# Patient Record
Sex: Female | Born: 1970 | Race: White | Hispanic: No | Marital: Married | Smoking: Former smoker
Health system: Southern US, Community
[De-identification: ages and names within clinical notes are randomized; demographics above are authoritative.]

## PROBLEM LIST (undated history)

## (undated) DIAGNOSIS — E119 Type 2 diabetes mellitus without complications: Secondary | ICD-10-CM

## (undated) DIAGNOSIS — K760 Fatty (change of) liver, not elsewhere classified: Secondary | ICD-10-CM

## (undated) DIAGNOSIS — E7800 Pure hypercholesterolemia, unspecified: Secondary | ICD-10-CM

## (undated) DIAGNOSIS — E042 Nontoxic multinodular goiter: Secondary | ICD-10-CM

## (undated) DIAGNOSIS — E559 Vitamin D deficiency, unspecified: Secondary | ICD-10-CM

## (undated) DIAGNOSIS — G5603 Carpal tunnel syndrome, bilateral upper limbs: Secondary | ICD-10-CM

## (undated) DIAGNOSIS — I10 Essential (primary) hypertension: Secondary | ICD-10-CM

## (undated) DIAGNOSIS — K219 Gastro-esophageal reflux disease without esophagitis: Secondary | ICD-10-CM

## (undated) HISTORY — PX: TONSILLECTOMY: SUR1361

## (undated) HISTORY — PX: BLADDER SURGERY: SHX569

## (undated) HISTORY — PX: CARPAL TUNNEL RELEASE: SHX101

---

## 2006-04-07 ENCOUNTER — Other Ambulatory Visit: Payer: Self-pay

## 2006-04-07 ENCOUNTER — Emergency Department: Payer: Self-pay | Admitting: Emergency Medicine

## 2006-11-07 ENCOUNTER — Emergency Department: Payer: Self-pay | Admitting: Emergency Medicine

## 2009-04-19 ENCOUNTER — Emergency Department: Payer: Self-pay | Admitting: Emergency Medicine

## 2009-10-16 ENCOUNTER — Emergency Department: Payer: Self-pay | Admitting: Emergency Medicine

## 2009-12-01 ENCOUNTER — Emergency Department: Payer: Self-pay | Admitting: Emergency Medicine

## 2011-08-01 DIAGNOSIS — E559 Vitamin D deficiency, unspecified: Secondary | ICD-10-CM | POA: Insufficient documentation

## 2011-08-01 DIAGNOSIS — E7800 Pure hypercholesterolemia, unspecified: Secondary | ICD-10-CM | POA: Insufficient documentation

## 2011-08-01 DIAGNOSIS — K760 Fatty (change of) liver, not elsewhere classified: Secondary | ICD-10-CM | POA: Insufficient documentation

## 2011-08-01 DIAGNOSIS — I10 Essential (primary) hypertension: Secondary | ICD-10-CM | POA: Insufficient documentation

## 2013-03-03 DIAGNOSIS — IMO0002 Reserved for concepts with insufficient information to code with codable children: Secondary | ICD-10-CM | POA: Insufficient documentation

## 2013-03-03 DIAGNOSIS — E1165 Type 2 diabetes mellitus with hyperglycemia: Secondary | ICD-10-CM | POA: Insufficient documentation

## 2013-04-05 DIAGNOSIS — Z8041 Family history of malignant neoplasm of ovary: Secondary | ICD-10-CM | POA: Insufficient documentation

## 2013-10-06 ENCOUNTER — Emergency Department: Payer: Self-pay | Admitting: Emergency Medicine

## 2013-10-06 LAB — BASIC METABOLIC PANEL
Creatinine: 0.9 mg/dL (ref 0.60–1.30)
EGFR (African American): >60
Glucose: 241 mg/dL — ABNORMAL HIGH (ref 65–99)
Osmolality: 284 (ref 275–301)
Potassium: 3.8 mmol/L (ref 3.5–5.1)
Sodium: 138 mmol/L (ref 136–145)

## 2013-10-06 LAB — CBC
HCT: 41 % (ref 35.0–47.0)
HGB: 14.3 g/dL (ref 12.0–16.0)
MCH: 29.4 pg (ref 26.0–34.0)
MCV: 84 fL (ref 80–100)
Platelet: 133 10*3/uL — ABNORMAL LOW (ref 150–440)

## 2013-10-06 LAB — TROPONIN I: Troponin-I: <0.02 ng/mL

## 2014-03-07 ENCOUNTER — Ambulatory Visit: Payer: Self-pay | Admitting: Family Medicine

## 2015-05-02 ENCOUNTER — Other Ambulatory Visit: Payer: Self-pay | Admitting: Family Medicine

## 2015-05-02 DIAGNOSIS — Z1231 Encounter for screening mammogram for malignant neoplasm of breast: Secondary | ICD-10-CM

## 2015-05-06 ENCOUNTER — Ambulatory Visit
Admission: RE | Admit: 2015-05-06 | Discharge: 2015-05-06 | Disposition: A | Discharge status: Home / Self Care | Payer: BLUE CROSS/BLUE SHIELD | Source: Ambulatory Visit | Attending: Family Medicine | Admitting: Family Medicine

## 2015-05-06 DIAGNOSIS — Z1231 Encounter for screening mammogram for malignant neoplasm of breast: Secondary | ICD-10-CM

## 2016-03-04 ENCOUNTER — Ambulatory Visit
Admission: RE | Admit: 2016-03-04 | Discharge: 2016-03-04 | Disposition: A | Discharge status: Home / Self Care | Payer: BLUE CROSS/BLUE SHIELD | Source: Ambulatory Visit | Attending: Family Medicine | Admitting: Family Medicine

## 2016-03-04 ENCOUNTER — Other Ambulatory Visit: Payer: Self-pay | Admitting: Family Medicine

## 2016-03-04 DIAGNOSIS — M4686 Other specified inflammatory spondylopathies, lumbar region: Secondary | ICD-10-CM | POA: Diagnosis not present

## 2016-03-04 DIAGNOSIS — M545 Low back pain: Secondary | ICD-10-CM | POA: Diagnosis present

## 2016-04-13 ENCOUNTER — Other Ambulatory Visit: Payer: Self-pay | Admitting: Family Medicine

## 2016-04-13 DIAGNOSIS — Z1231 Encounter for screening mammogram for malignant neoplasm of breast: Secondary | ICD-10-CM

## 2016-06-17 ENCOUNTER — Ambulatory Visit: Payer: BLUE CROSS/BLUE SHIELD

## 2016-07-08 ENCOUNTER — Other Ambulatory Visit: Payer: Self-pay | Admitting: Family Medicine

## 2016-07-08 ENCOUNTER — Ambulatory Visit
Admission: RE | Admit: 2016-07-08 | Discharge: 2016-07-08 | Disposition: A | Discharge status: Home / Self Care | Payer: BLUE CROSS/BLUE SHIELD | Source: Ambulatory Visit | Attending: Family Medicine | Admitting: Family Medicine

## 2016-07-08 DIAGNOSIS — Z1231 Encounter for screening mammogram for malignant neoplasm of breast: Secondary | ICD-10-CM

## 2016-10-22 DIAGNOSIS — Z6841 Body Mass Index (BMI) 40.0 and over, adult: Secondary | ICD-10-CM | POA: Insufficient documentation

## 2016-11-05 ENCOUNTER — Other Ambulatory Visit: Payer: Self-pay | Admitting: Family Medicine

## 2016-11-05 DIAGNOSIS — Z8041 Family history of malignant neoplasm of ovary: Secondary | ICD-10-CM

## 2016-11-13 ENCOUNTER — Ambulatory Visit
Admission: RE | Admit: 2016-11-13 | Discharge: 2016-11-13 | Disposition: A | Discharge status: Home / Self Care | Payer: BLUE CROSS/BLUE SHIELD | Source: Ambulatory Visit | Attending: Family Medicine | Admitting: Family Medicine

## 2016-11-13 DIAGNOSIS — Z8041 Family history of malignant neoplasm of ovary: Secondary | ICD-10-CM | POA: Diagnosis present

## 2016-11-13 DIAGNOSIS — R938 Abnormal findings on diagnostic imaging of other specified body structures: Secondary | ICD-10-CM | POA: Diagnosis not present

## 2016-11-27 DIAGNOSIS — G5603 Carpal tunnel syndrome, bilateral upper limbs: Secondary | ICD-10-CM | POA: Insufficient documentation

## 2017-02-14 ENCOUNTER — Encounter: Payer: Self-pay | Admitting: Medical Oncology

## 2017-02-14 ENCOUNTER — Emergency Department: Payer: BLUE CROSS/BLUE SHIELD

## 2017-02-14 ENCOUNTER — Emergency Department
Admission: EM | Admit: 2017-02-14 | Discharge: 2017-02-14 | Disposition: A | Discharge status: Home / Self Care | Payer: BLUE CROSS/BLUE SHIELD | Attending: Emergency Medicine | Admitting: Emergency Medicine

## 2017-02-14 DIAGNOSIS — Z7984 Long term (current) use of oral hypoglycemic drugs: Secondary | ICD-10-CM | POA: Insufficient documentation

## 2017-02-14 DIAGNOSIS — G510 Bell's palsy: Secondary | ICD-10-CM | POA: Diagnosis not present

## 2017-02-14 DIAGNOSIS — I10 Essential (primary) hypertension: Secondary | ICD-10-CM | POA: Insufficient documentation

## 2017-02-14 DIAGNOSIS — R202 Paresthesia of skin: Secondary | ICD-10-CM

## 2017-02-14 DIAGNOSIS — Z79899 Other long term (current) drug therapy: Secondary | ICD-10-CM | POA: Insufficient documentation

## 2017-02-14 DIAGNOSIS — E119 Type 2 diabetes mellitus without complications: Secondary | ICD-10-CM | POA: Diagnosis not present

## 2017-02-14 HISTORY — DX: Pure hypercholesterolemia, unspecified: E78.00

## 2017-02-14 HISTORY — DX: Essential (primary) hypertension: I10

## 2017-02-14 HISTORY — DX: Type 2 diabetes mellitus without complications: E11.9

## 2017-02-14 LAB — CBC
HCT: 43.6 % (ref 35.0–47.0)
HEMOGLOBIN: 14.6 g/dL (ref 12.0–16.0)
MCH: 27.9 pg (ref 26.0–34.0)
MCHC: 33.5 g/dL (ref 32.0–36.0)
MCV: 83.3 fL (ref 80.0–100.0)
PLATELETS: 151 10*3/uL (ref 150–440)
RBC: 5.24 MIL/uL — AB (ref 3.80–5.20)
RDW: 13.4 % (ref 11.5–14.5)
WBC: 7.6 10*3/uL (ref 3.6–11.0)

## 2017-02-14 LAB — COMPREHENSIVE METABOLIC PANEL
ALBUMIN: 3.9 g/dL (ref 3.5–5.0)
ALT: 76 U/L — ABNORMAL HIGH (ref 14–54)
ANION GAP: 10 (ref 5–15)
AST: 75 U/L — AB (ref 15–41)
Alkaline Phosphatase: 44 U/L (ref 38–126)
BUN: 15 mg/dL (ref 6–20)
CHLORIDE: 105 mmol/L (ref 101–111)
CO2: 23 mmol/L (ref 22–32)
Calcium: 8.8 mg/dL — ABNORMAL LOW (ref 8.9–10.3)
Creatinine, Ser: 0.68 mg/dL (ref 0.44–1.00)
GFR calc Af Amer: >60 mL/min (ref >60)
GFR calc non Af Amer: >60 mL/min (ref >60)
GLUCOSE: 188 mg/dL — AB (ref 65–99)
POTASSIUM: 3.9 mmol/L (ref 3.5–5.1)
SODIUM: 138 mmol/L (ref 135–145)
Total Bilirubin: 0.5 mg/dL (ref 0.3–1.2)
Total Protein: 7.5 g/dL (ref 6.5–8.1)

## 2017-02-14 MED ORDER — PREDNISONE 20 MG PO TABS: 40.0000 mg | ORAL_TABLET | Freq: Every day | ORAL | 0 refills | Status: AC

## 2017-02-14 MED ORDER — VALACYCLOVIR HCL 1 G PO TABS: 2000.0000 mg | ORAL_TABLET | Freq: Three times a day (TID) | ORAL | 0 refills | Status: AC

## 2017-02-14 NOTE — ED Notes (Signed)

## 2017-02-14 NOTE — ED Provider Notes (Signed)
[facility] Emergency Department Provider Note  Time seen: 10:05 AM  I have reviewed the triage vital signs and the nursing notes.   HISTORY  Chief Complaint Numbness    HPI Whitney Morris is a 46 y.o. female  with a past medical history of diabetes, hypertension, hyperlipidemia who presents to the emergency department with left facial numbness. According to the patient since yesterday morning she has been experiencing tingling and numbness of the left face and left side of the tongue. Denies any facial droop. Denies any headache. Denies any arm weakness or numbness. Denies confusion slurred speech or difficulty thinking. Patient denies any recent illnesses, cough congestion and ear pain or ringing. Patient denies any history of stroke or irregular heart rhythm.  Past Medical History:  Diagnosis Date  . Diabetes mellitus without complication ([place])   . High cholesterol   . Hypertension     There are no active problems to display for this patient.   Past Surgical History:  Procedure Laterality Date  . CARPAL TUNNEL RELEASE      Prior to Admission medications   Not on File    Allergies  Allergen Reactions  . Septra [Sulfamethoxazole-Trimethoprim]     Family History  Problem Relation Age of Onset  . Breast cancer Neg Hx     Social History Social History  Substance Use Topics  . Smoking status: Not on file  . Smokeless tobacco: Not on file  . Alcohol use Not on file    Review of Systems Constitutional: Negative for fever. Eyes: Negative for visual changes. ENT: Negative for congestion. No ear pain. Cardiovascular: Negative for chest pain. Respiratory: Negative for shortness of breath. Gastrointestinal: Negative for abdominal pain, vomiting Musculoskeletal: No back or extremity pain. Skin: Negative for rash. Neurological: Negative for headaches, focal weakness. Tingling of the left side of her face. All other ROS  negative  ____________________________________________   PHYSICAL EXAM:  VITAL SIGNS: ED Triage Vitals [02/14/17 0939]  Enc Vitals Group     BP      Pulse Rate (!) 103     Resp 18     Temp 98.3 F (36.8 C)     Temp Source Oral     SpO2 100 %     Weight 290 lb (131.5 kg)     Height 5\' 9"  (1.753 m)     Head Circumference      Peak Flow      Pain Score      Pain Loc      Pain Edu?      Excl. in [place]?     Constitutional: Alert and oriented. Well appearing and in no distress. Eyes: Normal exam ENT   Head: Normocephalic and atraumatic   Mouth/Throat: Mucous membranes are moist. Cardiovascular: Normal rate, regular rhythm. No murmur Respiratory: Normal respiratory effort without tachypnea nor retractions. Breath sounds are clear  Gastrointestinal: Soft and nontender. No distention.  T Musculoskeletal: Nontender with normal range of motion in all extremities.  Neurologic:  Normal speech and language. Possible slight left facial droop. Cranial nerves otherwise intact denies any sensory deficits on exam although states she feels a tingling sensation. Equal grip strengths. 5/5 motor in all extremities. Finger to nose testing intact. No pronator drift. Skin:  Skin is warm, dry and intact.  Psychiatric: Mood and affect are normal.  ____________________________________________     RADIOLOGY  MRI is normal  ____________________________________________   INITIAL IMPRESSION / ASSESSMENT AND PLAN / ED COURSE  Pertinent labs & imaging results that were available during my care of the patient were reviewed by me and considered in my medical decision making (see chart for details).  The patient presents to the emergency department with left facial tingling/numbness. On exam the patient has a possible very slight left facial droop. Denies any sensory changes on my exam however states she feels a tingling sensation in the left face. Patient's symptoms are concerning for possible  CVA versus early Bell's palsy. Patient's neurological exam is otherwise normal/intact. We'll check labs, proceed with an MRI of the brain to further evaluate. Patient agreeable plan.  MRI is normal. Given the slight numbness sensation with possible slight facial droop we will treat for early Bell's palsy. I discussed precautions including taping her eye shut taking the medications and following up with her doctor. Patient agreeable to plan.  ____________________________________________   FINAL CLINICAL IMPRESSION(S) / ED DIAGNOSES  Paresthesia Left facial numbness    Harvest Dark, MD 02/14/17 1306

## 2017-02-14 NOTE — ED Notes (Signed)
Patient denies pain and is resting comfortably.  

## 2017-02-14 NOTE — ED Triage Notes (Signed)
Pt reports left sided facial numbness that began yesterday morning around 1030. No other weaknesses/numbness. Grips equal, pt ambulatory, speech clear. No drooping noticed at time of triage. Denies pain.

## 2017-06-29 ENCOUNTER — Other Ambulatory Visit: Payer: Self-pay | Admitting: Family Medicine

## 2017-06-29 DIAGNOSIS — Z1231 Encounter for screening mammogram for malignant neoplasm of breast: Secondary | ICD-10-CM

## 2017-07-14 ENCOUNTER — Ambulatory Visit
Admission: RE | Admit: 2017-07-14 | Discharge: 2017-07-14 | Disposition: A | Discharge status: Home / Self Care | Payer: BLUE CROSS/BLUE SHIELD | Source: Ambulatory Visit | Attending: Family Medicine | Admitting: Family Medicine

## 2017-07-14 DIAGNOSIS — Z1231 Encounter for screening mammogram for malignant neoplasm of breast: Secondary | ICD-10-CM | POA: Insufficient documentation

## 2018-06-03 ENCOUNTER — Emergency Department
Admission: EM | Admit: 2018-06-03 | Discharge: 2018-06-04 | Disposition: A | Discharge status: Home / Self Care | Payer: BLUE CROSS/BLUE SHIELD | Attending: Emergency Medicine | Admitting: Emergency Medicine

## 2018-06-03 ENCOUNTER — Emergency Department: Payer: BLUE CROSS/BLUE SHIELD

## 2018-06-03 ENCOUNTER — Other Ambulatory Visit: Payer: Self-pay

## 2018-06-03 ENCOUNTER — Encounter: Payer: Self-pay | Admitting: Emergency Medicine

## 2018-06-03 DIAGNOSIS — R6889 Other general symptoms and signs: Secondary | ICD-10-CM

## 2018-06-03 DIAGNOSIS — E7800 Pure hypercholesterolemia, unspecified: Secondary | ICD-10-CM | POA: Insufficient documentation

## 2018-06-03 DIAGNOSIS — E119 Type 2 diabetes mellitus without complications: Secondary | ICD-10-CM | POA: Diagnosis not present

## 2018-06-03 DIAGNOSIS — Z794 Long term (current) use of insulin: Secondary | ICD-10-CM | POA: Insufficient documentation

## 2018-06-03 DIAGNOSIS — R0989 Other specified symptoms and signs involving the circulatory and respiratory systems: Secondary | ICD-10-CM

## 2018-06-03 DIAGNOSIS — Z79899 Other long term (current) drug therapy: Secondary | ICD-10-CM | POA: Insufficient documentation

## 2018-06-03 DIAGNOSIS — R070 Pain in throat: Secondary | ICD-10-CM | POA: Diagnosis present

## 2018-06-03 DIAGNOSIS — I10 Essential (primary) hypertension: Secondary | ICD-10-CM | POA: Insufficient documentation

## 2018-06-03 DIAGNOSIS — Z87891 Personal history of nicotine dependence: Secondary | ICD-10-CM | POA: Insufficient documentation

## 2018-06-03 DIAGNOSIS — R079 Chest pain, unspecified: Secondary | ICD-10-CM | POA: Diagnosis not present

## 2018-06-03 LAB — BASIC METABOLIC PANEL
ANION GAP: 6 (ref 5–15)
BUN: 15 mg/dL (ref 6–20)
CHLORIDE: 104 mmol/L (ref 98–111)
CO2: 29 mmol/L (ref 22–32)
Calcium: 9 mg/dL (ref 8.9–10.3)
Creatinine, Ser: 0.66 mg/dL (ref 0.44–1.00)
GFR calc non Af Amer: >60 mL/min (ref >60)
Glucose, Bld: 127 mg/dL — ABNORMAL HIGH (ref 70–99)
POTASSIUM: 3.5 mmol/L (ref 3.5–5.1)
SODIUM: 139 mmol/L (ref 135–145)

## 2018-06-03 LAB — CBC
HEMATOCRIT: 36.5 % (ref 35.0–47.0)
HEMOGLOBIN: 12.9 g/dL (ref 12.0–16.0)
MCH: 29.4 pg (ref 26.0–34.0)
MCHC: 35.3 g/dL (ref 32.0–36.0)
MCV: 83.3 fL (ref 80.0–100.0)
Platelets: 143 10*3/uL — ABNORMAL LOW (ref 150–440)
RBC: 4.38 MIL/uL (ref 3.80–5.20)
RDW: 13.3 % (ref 11.5–14.5)
WBC: 8.4 10*3/uL (ref 3.6–11.0)

## 2018-06-03 LAB — TROPONIN I: Troponin I: <0.03 ng/mL (ref <0.03)

## 2018-06-03 NOTE — ED Triage Notes (Signed)
C/O sore throat and chest tightness intermittently x 2 weeks.  AAOx3.  Skin warm and dry. NAD

## 2018-06-03 NOTE — ED Triage Notes (Signed)
Pt ambulatory to triage with no difficulty. Pt reports chest pain in her upper chest and into the bottom of her throat intermittently for 2 weeks. Reports hx of diabetes, htn and high cholesterol. Pt denies shob, n/v or diaphoresis with the pain. Denies cough.

## 2018-06-04 ENCOUNTER — Emergency Department: Payer: BLUE CROSS/BLUE SHIELD

## 2018-06-04 ENCOUNTER — Encounter: Payer: Self-pay | Admitting: Emergency Medicine

## 2018-06-04 LAB — TROPONIN I: Troponin I: <0.03 ng/mL (ref <0.03)

## 2018-06-04 MED ORDER — PREDNISONE 10 MG (21) PO TBPK: ORAL_TABLET | ORAL | 0 refills | Status: AC

## 2018-06-04 MED ORDER — DEXAMETHASONE SODIUM PHOSPHATE 10 MG/ML IJ SOLN
INTRAMUSCULAR | Status: AC
  Administered 2018-06-04: 10 mg via ORAL
  Filled 2018-06-04: qty 1

## 2018-06-04 MED ORDER — DEXAMETHASONE 10 MG/ML FOR PEDIATRIC ORAL USE
10.0000 mg | Freq: Once | INTRAMUSCULAR | Status: AC
  Administered 2018-06-04: 10 mg via ORAL

## 2018-06-04 NOTE — ED Provider Notes (Signed)
[facility] Emergency Department Provider Note   ____________________________________________   First MD Initiated Contact with Patient 06/04/18 0033     (approximate)  I have reviewed the triage vital signs and the nursing notes.   HISTORY  Chief Complaint Chest Pain    HPI Whitney Morris is a 47 y.o. female who comes into the hospital today with some tightness in his throat for the past 2 weeks.  She reports it is been really bad in the last 2 days.  She states that she feels like she cannot breathe.  She does not have any food allergies but is allergic to Septra.  She states it is constant and sometimes it hurts.  She is tried some heartburn medicine and ibuprofen which did not help. Tylenol also didn't seen to help.  The patient denies any fevers or cough but she has had some congestion fullness and tightness in her chest.  She states that she has had a chest fullness and tightness with heartburn and indigestion in the past.  The patient was concerned so she decided to come into the hospital today for evaluation.  Patient denies any hoarseness to her voice but she reports that when she seen she feels as though her voice is straining.  The patient rates her pain a 2 out of 10 in intensity   Past Medical History:  Diagnosis Date  . Diabetes mellitus without complication ([place])   . High cholesterol   . Hypertension     There are no active problems to display for this patient.   Past Surgical History:  Procedure Laterality Date  . CARPAL TUNNEL RELEASE    . TONSILLECTOMY      Prior to Admission medications   Medication Sig Start Date End Date Taking? Authorizing Provider  LANTUS 100 UNIT/ML injection Inject 79 Units into the skin at bedtime.  02/04/17   [provider]  lisinopril (PRINIVIL,ZESTRIL) 20 MG tablet Take 20 mg by mouth daily. 12/09/16   [provider]  meloxicam (MOBIC) 15 MG tablet Take 15 mg by mouth daily.  01/23/17   [provider]  metFORMIN (GLUCOPHAGE-XR) 500 MG 24 hr tablet Take 1,000 mg by mouth daily with supper. 01/18/17   [provider]  NOVOLOG FLEXPEN 100 UNIT/ML FlexPen Inject 35 Units into the skin 3 (three) times daily with meals. 12/02/16   [provider]  predniSONE (STERAPRED UNI-PAK 21 TAB) 10 MG (21) TBPK tablet Take 6 tabs on day 1 Take 5 tabs on day 2 Take 4 tabs on day 3 Take 3 tabs on day 4 Take 2 tabs on day 5 Take 1 tab on day 6 06/04/18   Loney Hering, MD  ranitidine (ZANTAC) 150 MG tablet Take 150 mg by mouth 2 (two) times daily as needed. 07/10/09   [provider]  simvastatin (ZOCOR) 20 MG tablet Take 20 mg by mouth at bedtime. 12/09/16   [provider]    Allergies Septra [sulfamethoxazole-trimethoprim]  Family History  Problem Relation Age of Onset  . Breast cancer Neg Hx     Social History Social History   Tobacco Use  . Smoking status: Former Research scientist (life sciences)  . Smokeless tobacco: Never Used  Substance Use Topics  . Alcohol use: Not Currently  . Drug use: Never    Review of Systems  Constitutional: No fever/chills Eyes: No visual changes. ENT: Throat tightness Cardiovascular: Chest tightness. Respiratory: Denies shortness of breath. Gastrointestinal: No abdominal pain.  No nausea,  no vomiting.  No diarrhea.  No constipation. Genitourinary: Negative for dysuria. Musculoskeletal: Negative for back pain. Skin: Negative for rash. Neurological: Negative for headaches, focal weakness or numbness.   ____________________________________________   PHYSICAL EXAM:  VITAL SIGNS: ED Triage Vitals  Enc Vitals Group     BP 06/03/18 2119 137/72     Pulse Rate 06/03/18 2119 91     Resp 06/03/18 2119 18     Temp 06/03/18 2119 98.3 F (36.8 C)     Temp Source 06/03/18 2119 Oral     SpO2 06/03/18 2119 100 %     Weight 06/03/18 2119 267 lb (121.1 kg)     Height 06/03/18 2119 5' 9.5" (1.765 m)     Head  Circumference --      Peak Flow --      Pain Score 06/03/18 2118 2     Pain Loc --      Pain Edu? --      Excl. in [place]? --     Constitutional: Alert and oriented. Well appearing and in mild distress. Eyes: Conjunctivae are normal. PERRL. EOMI. Head: Atraumatic. Nose: No congestion/rhinnorhea. Mouth/Throat: Mucous membranes are moist.  Oropharynx non-erythematous.  No swelling to the posterior oropharynx patient voice clear and normal Neck: No stridor.   Cardiovascular: Normal rate, regular rhythm. Grossly normal heart sounds.  Good peripheral circulation. Respiratory: Normal respiratory effort.  No retractions. Lungs CTAB. Gastrointestinal: Soft and nontender. No distention.  Positive bowel sounds Musculoskeletal: No lower extremity tenderness nor edema.   Neurologic:  Normal speech and language. No gross focal neurologic deficits are appreciated. No gait instability. Skin:  Skin is warm, dry and intact. No rash noted. Psychiatric: Mood and affect are normal. Speech and behavior are normal.  ____________________________________________   LABS (all labs ordered are listed, but only abnormal results are displayed)  Labs Reviewed  BASIC METABOLIC PANEL - Abnormal; Notable for the following components:      Result Value   Glucose, Bld 127 (*)    All other components within normal limits  CBC - Abnormal; Notable for the following components:   Platelets 143 (*)    All other components within normal limits  TROPONIN I  TROPONIN I   ____________________________________________  EKG  ED ECG REPORT I, Loney Hering, the attending physician, personally viewed and interpreted this ECG.   Date: 06/03/2018  EKG Time: 2112  Rate: 85  Rhythm: normal sinus rhythm  Axis: normal  Intervals:none  ST&T Change: none  ____________________________________________  RADIOLOGY  ED MD interpretation:  CXR: No active cardiopulmonary disease  DG soft tissue neck:  Negative  Official radiology report(s): Dg Neck Soft Tissue  Result Date: 06/04/2018 CLINICAL DATA:  Sore throat with chest tightness EXAM: NECK SOFT TISSUES - 1+ VIEW COMPARISON:  None. FINDINGS: Normal prevertebral soft tissue thickness. No retropharyngeal gas. No radiopaque foreign body. Epiglottis is normal. Subglottic trachea is patent. IMPRESSION: Negative. Electronically Signed   By: Donavan Foil M.D.   On: 06/04/2018 01:31   Dg Chest 2 View  Result Date: 06/03/2018 CLINICAL DATA:  Chest pain EXAM: CHEST - 2 VIEW COMPARISON:  10/06/2013 FINDINGS: The heart size and mediastinal contours are within normal limits. Both lungs are clear. The visualized skeletal structures are unremarkable. IMPRESSION: No active cardiopulmonary disease. Electronically Signed   By: Ashley Royalty M.D.   On: 06/03/2018 21:42    ____________________________________________   PROCEDURES  Procedure(s) performed: None  Procedures  Critical Care performed: No  ____________________________________________  INITIAL IMPRESSION / ASSESSMENT AND PLAN / ED COURSE  As part of my medical decision making, I reviewed the following data within the electronic MEDICAL RECORD NUMBER Notes from prior ED visits and [place] Controlled Substance Database   This is a 47 year old female who comes into the hospital today with some throat tightness and chest tightness.  This is been going on for 2 weeks but seems to be getting worse over the last few days.  The patient is here today in the hospital for evaluation.  We did check some blood work on the patient to include a CBC, BMP and a troponin.  The patient's chest x-ray is negative.  I will repeat the troponin and perform a soft tissue neck x-ray.  I will give the patient a dose of dexamethasone and the patient will be reassessed.  The patient's neck x-ray was negative and the repeat troponin was negative.  She will be discharged home and encouraged to follow-up with otolaryngology       ____________________________________________   FINAL CLINICAL IMPRESSION(S) / ED DIAGNOSES  Final diagnoses:  Throat tightness  Chest pain, unspecified type     ED Discharge Orders         Ordered    predniSONE (STERAPRED UNI-PAK 21 TAB) 10 MG (21) TBPK tablet     06/04/18 0140           Note:  This document was prepared using Dragon voice recognition software and may include unintentional dictation errors.    Loney Hering, MD 06/04/18 [phone]

## 2018-06-04 NOTE — Discharge Instructions (Addendum)
Please follow up with ENT for further evaluation of your throat tightness

## 2018-06-04 NOTE — ED Notes (Signed)
Pt is going to medical imaging.   

## 2018-06-04 NOTE — ED Notes (Signed)
Pt is back from medical imaging. 

## 2018-08-18 ENCOUNTER — Other Ambulatory Visit: Payer: Self-pay | Admitting: Family Medicine

## 2018-08-18 DIAGNOSIS — Z1231 Encounter for screening mammogram for malignant neoplasm of breast: Secondary | ICD-10-CM

## 2018-09-01 ENCOUNTER — Ambulatory Visit
Admission: RE | Admit: 2018-09-01 | Discharge: 2018-09-01 | Disposition: A | Discharge status: Home / Self Care | Payer: BLUE CROSS/BLUE SHIELD | Source: Ambulatory Visit | Attending: Family Medicine | Admitting: Family Medicine

## 2018-09-01 DIAGNOSIS — Z1231 Encounter for screening mammogram for malignant neoplasm of breast: Secondary | ICD-10-CM | POA: Insufficient documentation

## 2019-01-16 DIAGNOSIS — E042 Nontoxic multinodular goiter: Secondary | ICD-10-CM | POA: Insufficient documentation

## 2019-05-17 DIAGNOSIS — G8929 Other chronic pain: Secondary | ICD-10-CM | POA: Diagnosis not present

## 2019-05-17 DIAGNOSIS — Z8041 Family history of malignant neoplasm of ovary: Secondary | ICD-10-CM | POA: Diagnosis not present

## 2019-05-17 DIAGNOSIS — M170 Bilateral primary osteoarthritis of knee: Secondary | ICD-10-CM | POA: Diagnosis not present

## 2019-05-17 DIAGNOSIS — M25562 Pain in left knee: Secondary | ICD-10-CM | POA: Diagnosis not present

## 2019-05-17 DIAGNOSIS — E1165 Type 2 diabetes mellitus with hyperglycemia: Secondary | ICD-10-CM | POA: Diagnosis not present

## 2019-05-17 DIAGNOSIS — M25561 Pain in right knee: Secondary | ICD-10-CM | POA: Diagnosis not present

## 2019-05-31 DIAGNOSIS — M25462 Effusion, left knee: Secondary | ICD-10-CM | POA: Diagnosis not present

## 2019-05-31 DIAGNOSIS — M1712 Unilateral primary osteoarthritis, left knee: Secondary | ICD-10-CM | POA: Diagnosis not present

## 2019-05-31 DIAGNOSIS — M25562 Pain in left knee: Secondary | ICD-10-CM | POA: Diagnosis not present

## 2019-06-13 DIAGNOSIS — Z8371 Family history of colonic polyps: Secondary | ICD-10-CM | POA: Diagnosis not present

## 2019-06-13 DIAGNOSIS — Z1211 Encounter for screening for malignant neoplasm of colon: Secondary | ICD-10-CM | POA: Diagnosis not present

## 2019-06-13 DIAGNOSIS — Z01818 Encounter for other preprocedural examination: Secondary | ICD-10-CM | POA: Diagnosis not present

## 2019-07-03 DIAGNOSIS — E559 Vitamin D deficiency, unspecified: Secondary | ICD-10-CM | POA: Diagnosis not present

## 2019-07-03 DIAGNOSIS — E782 Mixed hyperlipidemia: Secondary | ICD-10-CM | POA: Diagnosis not present

## 2019-07-03 DIAGNOSIS — E1165 Type 2 diabetes mellitus with hyperglycemia: Secondary | ICD-10-CM | POA: Diagnosis not present

## 2019-07-03 DIAGNOSIS — I10 Essential (primary) hypertension: Secondary | ICD-10-CM | POA: Diagnosis not present

## 2019-07-04 DIAGNOSIS — Z8041 Family history of malignant neoplasm of ovary: Secondary | ICD-10-CM | POA: Diagnosis not present

## 2019-08-16 DIAGNOSIS — I10 Essential (primary) hypertension: Secondary | ICD-10-CM | POA: Diagnosis not present

## 2019-08-16 DIAGNOSIS — E782 Mixed hyperlipidemia: Secondary | ICD-10-CM | POA: Diagnosis not present

## 2019-08-16 DIAGNOSIS — E042 Nontoxic multinodular goiter: Secondary | ICD-10-CM | POA: Diagnosis not present

## 2019-08-16 DIAGNOSIS — E1165 Type 2 diabetes mellitus with hyperglycemia: Secondary | ICD-10-CM | POA: Diagnosis not present

## 2019-08-28 ENCOUNTER — Other Ambulatory Visit: Payer: Self-pay | Admitting: Family Medicine

## 2019-08-28 DIAGNOSIS — Z1231 Encounter for screening mammogram for malignant neoplasm of breast: Secondary | ICD-10-CM

## 2019-09-04 ENCOUNTER — Ambulatory Visit
Admission: RE | Admit: 2019-09-04 | Discharge: 2019-09-04 | Disposition: A | Discharge status: Home / Self Care | Payer: 59 | Source: Ambulatory Visit | Attending: Family Medicine | Admitting: Family Medicine

## 2019-09-04 DIAGNOSIS — Z1231 Encounter for screening mammogram for malignant neoplasm of breast: Secondary | ICD-10-CM | POA: Diagnosis not present

## 2019-10-09 ENCOUNTER — Other Ambulatory Visit: Payer: Self-pay

## 2019-10-09 ENCOUNTER — Other Ambulatory Visit
Admission: RE | Admit: 2019-10-09 | Discharge: 2019-10-09 | Disposition: A | Discharge status: Home / Self Care | Payer: Managed Care, Other (non HMO) | Source: Ambulatory Visit | Attending: Internal Medicine | Admitting: Internal Medicine

## 2019-10-09 DIAGNOSIS — Z20828 Contact with and (suspected) exposure to other viral communicable diseases: Secondary | ICD-10-CM | POA: Diagnosis not present

## 2019-10-09 DIAGNOSIS — Z01812 Encounter for preprocedural laboratory examination: Secondary | ICD-10-CM | POA: Insufficient documentation

## 2019-10-10 ENCOUNTER — Encounter: Payer: Self-pay | Admitting: Internal Medicine

## 2019-10-10 LAB — SARS CORONAVIRUS 2 (TAT 6-24 HRS): SARS Coronavirus 2: NEGATIVE

## 2019-10-11 ENCOUNTER — Ambulatory Visit: Payer: 59 | Admitting: Certified Registered Nurse Anesthetist

## 2019-10-11 ENCOUNTER — Ambulatory Visit
Admission: RE | Admit: 2019-10-11 | Discharge: 2019-10-11 | Disposition: A | Discharge status: Home / Self Care | Payer: 59 | Attending: Internal Medicine | Admitting: Internal Medicine

## 2019-10-11 ENCOUNTER — Other Ambulatory Visit: Payer: Self-pay

## 2019-10-11 ENCOUNTER — Encounter: Payer: Self-pay | Admitting: Internal Medicine

## 2019-10-11 ENCOUNTER — Encounter
Admission: RE | Disposition: A | Discharge status: Home / Self Care | Payer: Self-pay | Source: Home / Self Care | Attending: Internal Medicine

## 2019-10-11 DIAGNOSIS — D120 Benign neoplasm of cecum: Secondary | ICD-10-CM | POA: Diagnosis not present

## 2019-10-11 DIAGNOSIS — K635 Polyp of colon: Secondary | ICD-10-CM | POA: Diagnosis not present

## 2019-10-11 DIAGNOSIS — Z79899 Other long term (current) drug therapy: Secondary | ICD-10-CM | POA: Insufficient documentation

## 2019-10-11 DIAGNOSIS — Z882 Allergy status to sulfonamides status: Secondary | ICD-10-CM | POA: Diagnosis not present

## 2019-10-11 DIAGNOSIS — D122 Benign neoplasm of ascending colon: Secondary | ICD-10-CM | POA: Diagnosis not present

## 2019-10-11 DIAGNOSIS — E7800 Pure hypercholesterolemia, unspecified: Secondary | ICD-10-CM | POA: Insufficient documentation

## 2019-10-11 DIAGNOSIS — I10 Essential (primary) hypertension: Secondary | ICD-10-CM | POA: Diagnosis not present

## 2019-10-11 DIAGNOSIS — Z791 Long term (current) use of non-steroidal anti-inflammatories (NSAID): Secondary | ICD-10-CM | POA: Diagnosis not present

## 2019-10-11 DIAGNOSIS — Z1211 Encounter for screening for malignant neoplasm of colon: Secondary | ICD-10-CM | POA: Diagnosis not present

## 2019-10-11 DIAGNOSIS — E559 Vitamin D deficiency, unspecified: Secondary | ICD-10-CM | POA: Insufficient documentation

## 2019-10-11 DIAGNOSIS — Z87891 Personal history of nicotine dependence: Secondary | ICD-10-CM | POA: Diagnosis not present

## 2019-10-11 DIAGNOSIS — K648 Other hemorrhoids: Secondary | ICD-10-CM | POA: Diagnosis not present

## 2019-10-11 DIAGNOSIS — K640 First degree hemorrhoids: Secondary | ICD-10-CM | POA: Insufficient documentation

## 2019-10-11 DIAGNOSIS — E119 Type 2 diabetes mellitus without complications: Secondary | ICD-10-CM | POA: Insufficient documentation

## 2019-10-11 DIAGNOSIS — Z794 Long term (current) use of insulin: Secondary | ICD-10-CM | POA: Insufficient documentation

## 2019-10-11 DIAGNOSIS — Z881 Allergy status to other antibiotic agents status: Secondary | ICD-10-CM | POA: Diagnosis not present

## 2019-10-11 DIAGNOSIS — D126 Benign neoplasm of colon, unspecified: Secondary | ICD-10-CM | POA: Diagnosis not present

## 2019-10-11 DIAGNOSIS — Z8371 Family history of colonic polyps: Secondary | ICD-10-CM | POA: Insufficient documentation

## 2019-10-11 HISTORY — DX: Carpal tunnel syndrome, bilateral upper limbs: G56.03

## 2019-10-11 HISTORY — PX: COLONOSCOPY WITH PROPOFOL: SHX5780

## 2019-10-11 HISTORY — DX: Nontoxic multinodular goiter: E04.2

## 2019-10-11 HISTORY — DX: Fatty (change of) liver, not elsewhere classified: K76.0

## 2019-10-11 HISTORY — DX: Vitamin D deficiency, unspecified: E55.9

## 2019-10-11 LAB — GLUCOSE, CAPILLARY: Glucose-Capillary: 73 mg/dL (ref 70–99)

## 2019-10-11 SURGERY — COLONOSCOPY WITH PROPOFOL
Anesthesia: General

## 2019-10-11 MED ORDER — PROPOFOL 10 MG/ML IV BOLUS
INTRAVENOUS | Status: DC | PRN
  Administered 2019-10-11: 70 mg via INTRAVENOUS
  Administered 2019-10-11: 30 mg via INTRAVENOUS

## 2019-10-11 MED ORDER — GLYCOPYRROLATE 0.2 MG/ML IJ SOLN
INTRAMUSCULAR | Status: AC
  Filled 2019-10-11: qty 1

## 2019-10-11 MED ORDER — PROPOFOL 500 MG/50ML IV EMUL
INTRAVENOUS | Status: AC
  Filled 2019-10-11: qty 100

## 2019-10-11 MED ORDER — SODIUM CHLORIDE 0.9 % IV SOLN: INTRAVENOUS | Status: DC

## 2019-10-11 MED ORDER — LIDOCAINE HCL (PF) 2 % IJ SOLN
INTRAMUSCULAR | Status: AC
  Filled 2019-10-11: qty 10

## 2019-10-11 MED ORDER — PROPOFOL 500 MG/50ML IV EMUL
INTRAVENOUS | Status: DC | PRN
  Administered 2019-10-11: 120 ug/kg/min via INTRAVENOUS

## 2019-10-11 MED ORDER — MIDAZOLAM HCL 5 MG/5ML IJ SOLN
INTRAMUSCULAR | Status: DC | PRN
  Administered 2019-10-11: 2 mg via INTRAVENOUS

## 2019-10-11 MED ORDER — LIDOCAINE 2% (20 MG/ML) 5 ML SYRINGE
INTRAMUSCULAR | Status: DC | PRN
  Administered 2019-10-11: 25 mg via INTRAVENOUS

## 2019-10-11 MED ORDER — MIDAZOLAM HCL 2 MG/2ML IJ SOLN
INTRAMUSCULAR | Status: AC
  Filled 2019-10-11: qty 2

## 2019-10-11 NOTE — Interval H&P Note (Signed)
History and Physical Interval Note:  10/11/2019 9:13 AM  Whitney Morris  has presented today for surgery, with the diagnosis of FAMILY HX.OF COLON POLYPS.  The various methods of treatment have been discussed with the patient and family. After consideration of risks, benefits and other options for treatment, the patient has consented to  Procedure(s): COLONOSCOPY WITH PROPOFOL (N/A) as a surgical intervention.  The patient's history has been reviewed, patient examined, no change in status, stable for surgery.  I have reviewed the patient's chart and labs.  Questions were answered to the patient's satisfaction.     [place], [place]

## 2019-10-11 NOTE — H&P (Signed)
Outpatient short stay form Pre-procedure 10/11/2019 9:13 AM Jalee Saine K. Alice Reichert, M.D.  Primary Physician:  Hortencia Pilar, M.D.  Reason for visit:  Family hx of colon polyps.  History of present illness:  48 year old patient presenting for family history of colon polyps. Patient denies any change in bowel habits, rectal bleeding or involuntary weight loss.    Current Facility-Administered Medications:  .  0.9 %  sodium chloride infusion, , Intravenous, Continuous, [place], Benay Pike, MD, Last Rate: 20 mL/hr at 10/11/19 0855, New Bag at 10/11/19 [id number]  Medications Prior to Admission  Medication Sig Dispense Refill Last Dose  . lisinopril (PRINIVIL,ZESTRIL) 20 MG tablet Take 20 mg by mouth daily.  3 Past Week at Unknown time  . losartan (COZAAR) 25 MG tablet Take 25 mg by mouth daily.   Past Week at Unknown time  . meloxicam (MOBIC) 15 MG tablet Take 15 mg by mouth daily.  2 Past Week at Unknown time  . metFORMIN (GLUCOPHAGE-XR) 500 MG 24 hr tablet Take 1,000 mg by mouth daily with supper.  10 Past Week at Unknown time  . Semaglutide (OZEMPIC, 1 MG/DOSE, [place]) Inject into the skin.   Past Week at Unknown time  . desonide (DESOWEN) 0.05 % lotion Apply topically daily.   Not Taking at Unknown time  . ergocalciferol (VITAMIN D2) 1.25 MG (50000 UT) capsule Take 50,000 Units by mouth once a week.   Not Taking at Unknown time  . LANTUS 100 UNIT/ML injection Inject 79 Units into the skin at bedtime.   9   . NOVOLOG FLEXPEN 100 UNIT/ML FlexPen Inject 35 Units into the skin 3 (three) times daily with meals.  3 Not Taking at Unknown time  . omeprazole (PRILOSEC) 40 MG capsule Take 40 mg by mouth daily.   Not Taking at Unknown time  . predniSONE (STERAPRED UNI-PAK 21 TAB) 10 MG (21) TBPK tablet Take 6 tabs on day 1 Take 5 tabs on day 2 Take 4 tabs on day 3 Take 3 tabs on day 4 Take 2 tabs on day 5 Take 1 tab on day 6 (Patient not taking: Reported on 10/11/2019) 21 tablet 0 Not Taking at Unknown time  .  ranitidine (ZANTAC) 150 MG tablet Take 150 mg by mouth 2 (two) times daily as needed.   Not Taking at Unknown time  . simvastatin (ZOCOR) 20 MG tablet Take 20 mg by mouth at bedtime.  3 Not Taking at Unknown time     Allergies  Allergen Reactions  . Septra [Sulfamethoxazole-Trimethoprim]      Past Medical History:  Diagnosis Date  . Carpal tunnel syndrome, bilateral   . Diabetes mellitus without complication ([place])   . Goiter, nontoxic, multinodular   . Hepatic steatosis   . High cholesterol   . Hypertension   . Vitamin D deficiency disease     Review of systems:  Otherwise negative.    Physical Exam  Gen: Alert, oriented. Appears stated age.  HEENT: [place]/AT. PERRLA. Lungs: CTA, no wheezes. CV: RR nl S1, S2. Abd: soft, benign, no masses. BS+ Ext: No edema. Pulses 2+    Planned procedures: Proceed with colonoscopy. The patient understands the nature of the planned procedure, indications, risks, alternatives and potential complications including but not limited to bleeding, infection, perforation, damage to internal organs and possible oversedation/side effects from anesthesia. The patient agrees and gives consent to proceed.  Please refer to procedure notes for findings, recommendations and patient disposition/instructions.     Serigne Kubicek K. [place],  M.D. Gastroenterology 10/11/2019  9:13 AM

## 2019-10-11 NOTE — Op Note (Signed)
[facility] Gastroenterology Patient Name: Whitney Morris Procedure Date: 10/11/2019 9:14 AM MRN: [id number] Account #: [id number] Date of Birth: [birth year] Admit Type: Outpatient Age: 48 Room: [facility] ENDO ROOM 3 Gender: Female Note Status: Finalized Procedure:             Colonoscopy Indications:           Colon cancer screening in patient at increased risk:                         Family history of 1st-degree relative with colon polyps Providers:             Benay Pike. Kerisha Goughnour MD, MD Medicines:             Propofol per Anesthesia Complications:         No immediate complications. Estimated blood loss: None. Procedure:             Pre-Anesthesia Assessment:                        - The risks and benefits of the procedure and the                         sedation options and risks were discussed with the                         patient. All questions were answered and informed                         consent was obtained.                        - Patient identification and proposed procedure were                         verified prior to the procedure by the nurse. The                         procedure was verified in the procedure room.                        - ASA Grade Assessment: III - A patient with severe                         systemic disease.                        - After reviewing the risks and benefits, the patient                         was deemed in satisfactory condition to undergo the                         procedure.                        After obtaining informed consent, the colonoscope was                         passed under direct vision. Throughout the procedure,  the patient's blood pressure, pulse, and oxygen                         saturations were monitored continuously. The                         Colonoscope was introduced through the anus and                         advanced to the the cecum, identified by  appendiceal                         orifice and ileocecal valve. The colonoscopy was                         somewhat difficult due to significant looping.                         Successful completion of the procedure was aided by                         applying abdominal pressure. The patient tolerated the                         procedure well. The quality of the bowel preparation                         was fair. The ileocecal valve, appendiceal orifice,                         and rectum were photographed. Findings:      The perianal and digital rectal examinations were normal. Pertinent       negatives include normal sphincter tone and no palpable rectal lesions.      A 6 mm polyp was found in the ileocecal valve. The polyp was sessile.       The polyp was removed with a cold snare. Resection and retrieval were       complete.      A 17 mm polyp was found in the proximal ascending colon. The polyp was       pedunculated. The polyp was removed with a hot snare. Resection and       retrieval were complete. To prevent bleeding after the polypectomy, one       hemostatic clip was successfully placed (MR conditional). There was no       bleeding during, or at the end, of the procedure.      A 8 mm polyp was found in the proximal ascending colon. The polyp was       semi-pedunculated. The polyp was removed with a hot snare. Resection and       retrieval were complete.      A moderate amount of semi-liquid stool was found in the entire colon,       making visualization difficult. Lavage of the area was performed using a       moderate amount of sterile water, resulting in clearance with fair       visualization.      Non-bleeding internal hemorrhoids were found during retroflexion. The       hemorrhoids were Grade I (internal hemorrhoids  that do not prolapse).      The exam was otherwise without abnormality on direct and retroflexion       views. Impression:            - Preparation of  the colon was fair.                        - One 6 mm polyp at the ileocecal valve, removed with                         a cold snare. Resected and retrieved.                        - One 17 mm polyp in the proximal ascending colon,                         removed with a hot snare. Resected and retrieved. Clip                         (MR conditional) was placed.                        - One 8 mm polyp in the proximal ascending colon,                         removed with a hot snare. Resected and retrieved.                        - Stool in the entire examined colon.                        - Non-bleeding internal hemorrhoids.                        - The examination was otherwise normal on direct and                         retroflexion views. Recommendation:        - Patient has a contact number available for                         emergencies. The signs and symptoms of potential                         delayed complications were discussed with the patient.                         Return to normal activities tomorrow. Written                         discharge instructions were provided to the patient.                        - Resume previous diet.                        - Continue present medications.                        -  Await pathology results.                        - Repeat colonoscopy in 1 year for surveillance.                        - Return to GI office PRN.                        - The findings and recommendations were discussed with                         the patient. Procedure Code(s):     --- Professional ---                        [phone], Colonoscopy, flexible; with removal of                         tumor(s), polyp(s), or other lesion(s) by snare                         technique Diagnosis Code(s):     --- Professional ---                        K63.5, Polyp of colon                        K64.0, First degree hemorrhoids                        Z83.71, Family history  of colonic polyps CPT copyright 2019 [employer]. All rights reserved. The codes documented in this report are preliminary and upon coder review may  be revised to meet current compliance requirements. Efrain Sella MD, MD 10/11/2019 9:47:02 AM This report has been signed electronically. Number of Addenda: 0 Note Initiated On: 10/11/2019 9:14 AM Scope Withdrawal Time: 0 hours 13 minutes 31 seconds  Total Procedure Duration: 0 hours 21 minutes 55 seconds  Estimated Blood Loss:  Estimated blood loss: none.      [facility]

## 2019-10-11 NOTE — Anesthesia Post-op Follow-up Note (Signed)
Anesthesia QCDR form completed.        

## 2019-10-11 NOTE — Anesthesia Preprocedure Evaluation (Signed)
Anesthesia Evaluation  Patient identified by MRN, date of birth, ID band Patient awake    Reviewed: Allergy & Precautions, NPO status , Patient's Chart, lab work & pertinent test results  History of Anesthesia Complications Negative for: history of anesthetic complications  Airway Mallampati: II  TM Distance: >3 FB Neck ROM: Full    Dental no notable dental hx.    Pulmonary neg sleep apnea, neg COPD, former smoker,    breath sounds clear to auscultation- rhonchi (-) wheezing      Cardiovascular hypertension, Pt. on medications (-) CAD, (-) Past MI, (-) Cardiac Stents and (-) CABG  Rhythm:Regular Rate:Normal - Systolic murmurs and - Diastolic murmurs    Neuro/Psych neg Seizures negative neurological ROS  negative psych ROS   GI/Hepatic negative GI ROS, Neg liver ROS,   Endo/Other  diabetes, Insulin Dependent  Renal/GU negative Renal ROS     Musculoskeletal negative musculoskeletal ROS (+)   Abdominal (+) + obese,   Peds  Hematology negative hematology ROS (+)   Anesthesia Other Findings Past Medical History: No date: Carpal tunnel syndrome, bilateral No date: Diabetes mellitus without complication (HCC) No date: Goiter, nontoxic, multinodular No date: Hepatic steatosis No date: High cholesterol No date: Hypertension No date: Vitamin D deficiency disease   Reproductive/Obstetrics                             Anesthesia Physical Anesthesia Plan  ASA: II  Anesthesia Plan: General   Post-op Pain Management:    Induction: Intravenous  PONV Risk Score and Plan: 2 and Propofol infusion  Airway Management Planned: Natural Airway  Additional Equipment:   Intra-op Plan:   Post-operative Plan:   Informed Consent: I have reviewed the patients History and Physical, chart, labs and discussed the procedure including the risks, benefits and alternatives for the proposed anesthesia with  the patient or authorized representative who has indicated his/her understanding and acceptance.     Dental advisory given  Plan Discussed with: CRNA and Anesthesiologist  Anesthesia Plan Comments:         Anesthesia Quick Evaluation

## 2019-10-11 NOTE — Transfer of Care (Signed)
Immediate Anesthesia Transfer of Care Note  Patient: Whitney Morris  Procedure(s) Performed: COLONOSCOPY WITH PROPOFOL (N/A )  Patient Location: Endoscopy Unit  Anesthesia Type:General  Level of Consciousness: awake, alert  and oriented  Airway & Oxygen Therapy: Patient Spontanous Breathing  Post-op Assessment: Report given to RN and Post -op Vital signs reviewed and stable  Post vital signs: Reviewed  Last Vitals:  Vitals Value Taken Time  BP 117/73 10/11/19 0947  Temp 36.4 C 10/11/19 0947  Pulse 93 10/11/19 0947  Resp 19 10/11/19 0947  SpO2 97 % 10/11/19 0947  Vitals shown include unvalidated device data.  Last Pain:  Vitals:   10/11/19 0940  TempSrc: Temporal  PainSc:          Complications: No apparent anesthesia complications

## 2019-10-11 NOTE — Anesthesia Postprocedure Evaluation (Signed)
Anesthesia Post Note  Patient: Whitney Morris  Procedure(s) Performed: COLONOSCOPY WITH PROPOFOL (N/A )  Patient location during evaluation: Endoscopy Anesthesia Type: General Level of consciousness: awake and alert and oriented Pain management: pain level controlled Vital Signs Assessment: post-procedure vital signs reviewed and stable Respiratory status: spontaneous breathing, nonlabored ventilation and respiratory function stable Cardiovascular status: blood pressure returned to baseline and stable Postop Assessment: no signs of nausea or vomiting Anesthetic complications: no     Last Vitals:  Vitals:   10/11/19 1010 10/11/19 1011  BP: (!) 148/84   Pulse:    Resp: 15 19  Temp:    SpO2:      Last Pain:  Vitals:   10/11/19 0940  TempSrc: Temporal  PainSc:                  [name]

## 2019-10-12 ENCOUNTER — Encounter: Payer: Self-pay | Admitting: *Deleted

## 2019-10-16 LAB — SURGICAL PATHOLOGY

## 2019-11-03 DIAGNOSIS — I10 Essential (primary) hypertension: Secondary | ICD-10-CM | POA: Diagnosis not present

## 2019-11-03 DIAGNOSIS — Z23 Encounter for immunization: Secondary | ICD-10-CM | POA: Diagnosis not present

## 2019-11-03 DIAGNOSIS — Z0000 Encounter for general adult medical examination without abnormal findings: Secondary | ICD-10-CM | POA: Diagnosis not present

## 2019-11-03 DIAGNOSIS — E1165 Type 2 diabetes mellitus with hyperglycemia: Secondary | ICD-10-CM | POA: Diagnosis not present

## 2019-11-03 DIAGNOSIS — Z124 Encounter for screening for malignant neoplasm of cervix: Secondary | ICD-10-CM | POA: Diagnosis not present

## 2019-12-14 DIAGNOSIS — I10 Essential (primary) hypertension: Secondary | ICD-10-CM | POA: Diagnosis not present

## 2019-12-14 DIAGNOSIS — E1165 Type 2 diabetes mellitus with hyperglycemia: Secondary | ICD-10-CM | POA: Diagnosis not present

## 2019-12-14 DIAGNOSIS — E782 Mixed hyperlipidemia: Secondary | ICD-10-CM | POA: Diagnosis not present

## 2019-12-14 DIAGNOSIS — E559 Vitamin D deficiency, unspecified: Secondary | ICD-10-CM | POA: Diagnosis not present

## 2020-02-09 DIAGNOSIS — E119 Type 2 diabetes mellitus without complications: Secondary | ICD-10-CM | POA: Diagnosis not present

## 2020-03-21 DIAGNOSIS — E1165 Type 2 diabetes mellitus with hyperglycemia: Secondary | ICD-10-CM | POA: Diagnosis not present

## 2020-03-21 DIAGNOSIS — Z86010 Personal history of colonic polyps: Secondary | ICD-10-CM | POA: Diagnosis not present

## 2020-03-21 DIAGNOSIS — Z8371 Family history of colonic polyps: Secondary | ICD-10-CM | POA: Diagnosis not present

## 2020-03-21 DIAGNOSIS — Z6841 Body Mass Index (BMI) 40.0 and over, adult: Secondary | ICD-10-CM | POA: Diagnosis not present

## 2020-04-04 DIAGNOSIS — E782 Mixed hyperlipidemia: Secondary | ICD-10-CM | POA: Diagnosis not present

## 2020-04-04 DIAGNOSIS — E559 Vitamin D deficiency, unspecified: Secondary | ICD-10-CM | POA: Diagnosis not present

## 2020-04-04 DIAGNOSIS — I10 Essential (primary) hypertension: Secondary | ICD-10-CM | POA: Diagnosis not present

## 2020-04-04 DIAGNOSIS — E1165 Type 2 diabetes mellitus with hyperglycemia: Secondary | ICD-10-CM | POA: Diagnosis not present

## 2020-04-22 ENCOUNTER — Other Ambulatory Visit: Payer: Self-pay

## 2020-04-22 ENCOUNTER — Other Ambulatory Visit
Admission: RE | Admit: 2020-04-22 | Discharge: 2020-04-22 | Disposition: A | Discharge status: Home / Self Care | Payer: 59 | Source: Ambulatory Visit | Attending: Internal Medicine | Admitting: Internal Medicine

## 2020-04-22 DIAGNOSIS — Z20822 Contact with and (suspected) exposure to covid-19: Secondary | ICD-10-CM | POA: Diagnosis not present

## 2020-04-22 DIAGNOSIS — Z01812 Encounter for preprocedural laboratory examination: Secondary | ICD-10-CM | POA: Insufficient documentation

## 2020-04-22 LAB — SARS CORONAVIRUS 2 (TAT 6-24 HRS): SARS Coronavirus 2: NEGATIVE

## 2020-04-23 ENCOUNTER — Encounter: Payer: Self-pay | Admitting: Internal Medicine

## 2020-04-24 ENCOUNTER — Ambulatory Visit: Payer: 59 | Admitting: Anesthesiology

## 2020-04-24 ENCOUNTER — Ambulatory Visit
Admission: RE | Admit: 2020-04-24 | Discharge: 2020-04-24 | Disposition: A | Discharge status: Home / Self Care | Payer: 59 | Attending: Internal Medicine | Admitting: Internal Medicine

## 2020-04-24 ENCOUNTER — Encounter: Payer: Self-pay | Admitting: Internal Medicine

## 2020-04-24 ENCOUNTER — Other Ambulatory Visit: Payer: Self-pay

## 2020-04-24 ENCOUNTER — Encounter
Admission: RE | Disposition: A | Discharge status: Home / Self Care | Payer: Self-pay | Source: Home / Self Care | Attending: Internal Medicine

## 2020-04-24 DIAGNOSIS — E7800 Pure hypercholesterolemia, unspecified: Secondary | ICD-10-CM | POA: Diagnosis not present

## 2020-04-24 DIAGNOSIS — K640 First degree hemorrhoids: Secondary | ICD-10-CM | POA: Insufficient documentation

## 2020-04-24 DIAGNOSIS — Z79899 Other long term (current) drug therapy: Secondary | ICD-10-CM | POA: Diagnosis not present

## 2020-04-24 DIAGNOSIS — K635 Polyp of colon: Secondary | ICD-10-CM | POA: Diagnosis not present

## 2020-04-24 DIAGNOSIS — K648 Other hemorrhoids: Secondary | ICD-10-CM | POA: Diagnosis not present

## 2020-04-24 DIAGNOSIS — Z87891 Personal history of nicotine dependence: Secondary | ICD-10-CM | POA: Insufficient documentation

## 2020-04-24 DIAGNOSIS — K219 Gastro-esophageal reflux disease without esophagitis: Secondary | ICD-10-CM | POA: Diagnosis not present

## 2020-04-24 DIAGNOSIS — Z794 Long term (current) use of insulin: Secondary | ICD-10-CM | POA: Insufficient documentation

## 2020-04-24 DIAGNOSIS — Z86010 Personal history of colonic polyps: Secondary | ICD-10-CM | POA: Diagnosis not present

## 2020-04-24 DIAGNOSIS — K760 Fatty (change of) liver, not elsewhere classified: Secondary | ICD-10-CM | POA: Insufficient documentation

## 2020-04-24 DIAGNOSIS — Z1211 Encounter for screening for malignant neoplasm of colon: Secondary | ICD-10-CM | POA: Diagnosis not present

## 2020-04-24 DIAGNOSIS — K621 Rectal polyp: Secondary | ICD-10-CM | POA: Insufficient documentation

## 2020-04-24 DIAGNOSIS — I10 Essential (primary) hypertension: Secondary | ICD-10-CM | POA: Diagnosis not present

## 2020-04-24 DIAGNOSIS — D125 Benign neoplasm of sigmoid colon: Secondary | ICD-10-CM | POA: Diagnosis not present

## 2020-04-24 DIAGNOSIS — E119 Type 2 diabetes mellitus without complications: Secondary | ICD-10-CM | POA: Insufficient documentation

## 2020-04-24 HISTORY — DX: Gastro-esophageal reflux disease without esophagitis: K21.9

## 2020-04-24 HISTORY — PX: COLONOSCOPY WITH PROPOFOL: SHX5780

## 2020-04-24 LAB — GLUCOSE, CAPILLARY: Glucose-Capillary: 94 mg/dL (ref 70–99)

## 2020-04-24 SURGERY — COLONOSCOPY WITH PROPOFOL
Anesthesia: General

## 2020-04-24 MED ORDER — PROPOFOL 500 MG/50ML IV EMUL
INTRAVENOUS | Status: DC | PRN
  Administered 2020-04-24: 150 ug/kg/min via INTRAVENOUS

## 2020-04-24 MED ORDER — MIDAZOLAM HCL 2 MG/2ML IJ SOLN
INTRAMUSCULAR | Status: AC
  Filled 2020-04-24: qty 2

## 2020-04-24 MED ORDER — PROPOFOL 10 MG/ML IV BOLUS
INTRAVENOUS | Status: AC
  Filled 2020-04-24: qty 20

## 2020-04-24 MED ORDER — SODIUM CHLORIDE 0.9 % IV SOLN: INTRAVENOUS | Status: DC

## 2020-04-24 MED ORDER — MIDAZOLAM HCL 2 MG/2ML IJ SOLN
INTRAMUSCULAR | Status: DC | PRN
  Administered 2020-04-24: 2 mg via INTRAVENOUS

## 2020-04-24 MED ORDER — PROPOFOL 500 MG/50ML IV EMUL
INTRAVENOUS | Status: AC
  Filled 2020-04-24: qty 50

## 2020-04-24 NOTE — Op Note (Signed)
[facility] Patient Name: Whitney Morris Procedure Date: 04/24/2020 9:38 AM MRN: [id number] Account #: [id number] Date of Birth: 22-Oct-1970 Admit Type: Outpatient Age: 49 Room: [facility] ENDO ROOM 3 Gender: Female Note Status: Finalized Procedure:             Colonoscopy Indications:           High risk colon cancer surveillance: Personal history                         of multiple (3 or more) adenomas Providers:             Lorie Apley K. Alice Reichert MD, MD Referring MD:          Kerin Perna MD, MD (Referring MD) Medicines:             Propofol per Anesthesia Complications:         No immediate complications. Procedure:             Pre-Anesthesia Assessment:                        - The risks and benefits of the procedure and the                         sedation options and risks were discussed with the                         patient. All questions were answered and informed                         consent was obtained.                        - The risks and benefits of the procedure and the                         sedation options and risks were discussed with the                         patient. All questions were answered and informed                         consent was obtained.                        - Patient identification and proposed procedure were                         verified prior to the procedure by the nurse. The                         procedure was verified in the procedure room.                        - ASA Grade Assessment: III - A patient with severe                         systemic disease.                        -  After reviewing the risks and benefits, the patient                         was deemed in satisfactory condition to undergo the                         procedure.                        After obtaining informed consent, the colonoscope was                         passed under direct vision. Throughout the procedure,                          the patient's blood pressure, pulse, and oxygen                         saturations were monitored continuously. The                         Colonoscope was introduced through the anus and                         advanced to the the cecum, identified by appendiceal                         orifice and ileocecal valve. The colonoscopy was                         performed without difficulty. The patient tolerated                         the procedure well. The quality of the bowel                         preparation was adequate. The ileocecal valve,                         appendiceal orifice, and rectum were photographed. Findings:      The perianal and digital rectal examinations were normal. Pertinent       negatives include normal sphincter tone and no palpable rectal lesions.      Four sessile polyps were found in the rectum. The polyps were small in       size. These polyps were removed with a jumbo cold forceps. Resection and       retrieval were complete.      A 8 mm polyp was found in the sigmoid colon. The polyp was       semi-pedunculated. The polyp was removed with a cold snare. Resection       and retrieval were complete.      A 6 mm polyp was found in the cecum. The polyp was sessile. The polyp       was removed with a cold biopsy forceps. Resection and retrieval were       complete.      A small post polypectomy scar was found in the ascending colon. Adjacent       mucosal findings include congestion. This was biopsied with a cold  forceps for histology. The scar above had an attached hemoclip from       previous polypectomy 12/202020.      Non-bleeding internal hemorrhoids were found during retroflexion. The       hemorrhoids were Grade I (internal hemorrhoids that do not prolapse).      The exam was otherwise without abnormality. Impression:            - Four small polyps in the rectum, removed with a                         jumbo cold forceps.  Resected and retrieved.                        - One 8 mm polyp in the sigmoid colon, removed with a                         cold snare. Resected and retrieved.                        - One 6 mm polyp in the cecum, removed with a cold                         biopsy forceps. Resected and retrieved.                        - Post-polypectomy scar in the ascending colon.                         Biopsied.                        - Non-bleeding internal hemorrhoids.                        - The examination was otherwise normal. Recommendation:        - Patient has a contact number available for                         emergencies. The signs and symptoms of potential                         delayed complications were discussed with the patient.                         Return to normal activities tomorrow. Written                         discharge instructions were provided to the patient.                        - Resume previous diet.                        - Continue present medications.                        - Repeat colonoscopy is recommended for surveillance.                         The  colonoscopy date will be determined after                         pathology results from today's exam become available                         for review.                        - Return to GI office PRN.                        - The findings and recommendations were discussed with                         the patient. Procedure Code(s):     --- Professional ---                        [phone], Colonoscopy, flexible; with removal of                         tumor(s), polyp(s), or other lesion(s) by snare                         technique                        45380, 3, Colonoscopy, flexible; with biopsy, single                         or multiple Diagnosis Code(s):     --- Professional ---                        K64.0, First degree hemorrhoids                        Z98.890, Other specified postprocedural  states                        K63.5, Polyp of colon                        K62.1, Rectal polyp                        Z86.010, Personal history of colonic polyps CPT copyright 2019 [employer]. All rights reserved. The codes documented in this report are preliminary and upon coder review may  be revised to meet current compliance requirements. Efrain Sella MD, MD 04/24/2020 10:28:11 AM This report has been signed electronically. Number of Addenda: 0 Note Initiated On: 04/24/2020 9:38 AM Scope Withdrawal Time: 0 hours 8 minutes 38 seconds  Total Procedure Duration: 0 hours 22 minutes 0 seconds  Estimated Blood Loss:  Estimated blood loss: none.      [facility]

## 2020-04-24 NOTE — Anesthesia Postprocedure Evaluation (Signed)
Anesthesia Post Note  Patient: Whitney Morris  Procedure(s) Performed: COLONOSCOPY WITH PROPOFOL (N/A )  Patient location during evaluation: Endoscopy Anesthesia Type: General Level of consciousness: awake and alert Pain management: pain level controlled Vital Signs Assessment: post-procedure vital signs reviewed and stable Respiratory status: spontaneous breathing and respiratory function stable Cardiovascular status: stable Anesthetic complications: no   No complications documented.   Last Vitals:  Vitals:   04/24/20 1040 04/24/20 1050  BP: 136/76 (!) 143/78  Pulse:    Resp: 19 18  Temp:    SpO2: 98% 98%    Last Pain:  Vitals:   04/24/20 0900  TempSrc: Temporal  PainSc: 0-No pain                 [name] K

## 2020-04-24 NOTE — Anesthesia Procedure Notes (Signed)
Performed by: Vaughan Sine Pre-anesthesia Checklist: Patient identified, Emergency Drugs available, Suction available, Patient being monitored and Timeout performed Patient Re-evaluated:Patient Re-evaluated prior to induction Oxygen Delivery Method: Circle system utilized Preoxygenation: Pre-oxygenation with 100% oxygen Induction Type: IV induction Placement Confirmation: positive ETCO2 and CO2 detector

## 2020-04-24 NOTE — Transfer of Care (Signed)
Immediate Anesthesia Transfer of Care Note  Patient: Whitney Morris  Procedure(s) Performed: COLONOSCOPY WITH PROPOFOL (N/A )  Patient Location: PACU  Anesthesia Type:General  Level of Consciousness: awake and sedated  Airway & Oxygen Therapy: Patient Spontanous Breathing and Patient connected to nasal cannula oxygen  Post-op Assessment: Report given to RN and Post -op Vital signs reviewed and stable  Post vital signs: Reviewed and stable  Last Vitals:  Vitals Value Taken Time  BP    Temp    Pulse    Resp    SpO2      Last Pain:  Vitals:   04/24/20 0900  TempSrc: Temporal  PainSc: 0-No pain         Complications: No complications documented.

## 2020-04-24 NOTE — Interval H&P Note (Signed)
History and Physical Interval Note:  04/24/2020 9:36 AM  Whitney Morris  has presented today for surgery, with the diagnosis of HX ADEN POLYPS.  The various methods of treatment have been discussed with the patient and family. After consideration of risks, benefits and other options for treatment, the patient has consented to  Procedure(s): COLONOSCOPY WITH PROPOFOL (N/A) as a surgical intervention.  The patient's history has been reviewed, patient examined, no change in status, stable for surgery.  I have reviewed the patient's chart and labs.  Questions were answered to the patient's satisfaction.     [place], [place]

## 2020-04-24 NOTE — H&P (Signed)
Outpatient short stay form Pre-procedure 04/24/2020 9:35 AM Whitney Morris K. Whitney Morris, M.D.  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 Fair prep at last colonoscopy prompted sooner follow up colonoscopy recommendation.  History of present illness:                           Patient presents for colonoscopy for a personal hx of colon polyps. The patient denies abdominal pain, abnormal weight loss or rectal bleeding.      Current Facility-Administered Medications:  .  0.9 %  sodium chloride infusion, , Intravenous, Continuous, [place], Benay Pike, MD, Last Rate: 20 mL/hr at 04/24/20 0924, New Bag at 04/24/20 0924  Medications Prior to Admission  Medication Sig Dispense Refill Last Dose  . Insulin Degludec (TRESIBA [place]) Inject 100 Units into the skin at bedtime.     Marland Kitchen losartan (COZAAR) 25 MG tablet Take 25 mg by mouth daily.   04/23/2020 at Unknown time  . metFORMIN (GLUCOPHAGE-XR) 500 MG 24 hr tablet Take 500 mg by mouth 4 (four) times daily -  before meals and at bedtime.   10 04/23/2020 at Unknown time  . NOVOLOG FLEXPEN 100 UNIT/ML FlexPen Inject 20 Units into the skin 3 (three) times daily with meals.   3 04/23/2020 at Unknown time  . Semaglutide (OZEMPIC, 1 MG/DOSE, [place]) Inject 0.5 mg into the skin once a week. On Saturday   Past Week at Unknown time  . simvastatin (ZOCOR) 20 MG tablet Take 20 mg by mouth at bedtime.  3 04/23/2020 at Unknown time  . desonide (DESOWEN) 0.05 % lotion Apply topically daily. (Patient not taking: Reported on 04/24/2020)   Not Taking at Unknown time  . ergocalciferol (VITAMIN D2) 1.25 MG (50000 UT) capsule Take 50,000 Units by mouth once a week. (Patient not taking: Reported on 04/24/2020)   Not Taking at Unknown time  . LANTUS 100 UNIT/ML injection Inject 79 Units into the skin at bedtime.  (Patient not taking: Reported on 04/24/2020)  9 Not Taking at Unknown time  .  lisinopril (PRINIVIL,ZESTRIL) 20 MG tablet Take 20 mg by mouth daily. (Patient not taking: Reported on 04/24/2020)  3 Not Taking at Unknown time  . meloxicam (MOBIC) 15 MG tablet Take 15 mg by mouth daily. (Patient not taking: Reported on 04/24/2020)  2 Not Taking at Unknown time  . omeprazole (PRILOSEC) 40 MG capsule Take 40 mg by mouth daily. (Patient not taking: Reported on 04/24/2020)   Not Taking at Unknown time  . predniSONE (STERAPRED UNI-PAK 21 TAB) 10 MG (21) TBPK tablet Take 6 tabs on day 1 Take 5 tabs on day 2 Take 4 tabs on day 3 Take 3 tabs on day 4 Take 2 tabs on day 5 Take 1 tab on day 6 (Patient not taking: Reported on 10/11/2019) 21 tablet 0   . ranitidine (ZANTAC) 150 MG tablet Take 150 mg by mouth 2 (two) times daily as needed. (Patient not taking: Reported on 04/24/2020)   Not Taking at Unknown time     Allergies  Allergen Reactions  . Septra [Sulfamethoxazole-Trimethoprim] Other (See Comments)    Thrombocytopenia     Past Medical History:  Diagnosis Date  . Carpal tunnel syndrome, bilateral   . Diabetes mellitus without complication ([place])   . GERD (gastroesophageal reflux disease)   . Goiter, nontoxic, multinodular   . Hepatic steatosis   . High  cholesterol   . Hypertension   . Vitamin D deficiency disease     Review of systems:  Otherwise negative.    Physical Exam  Gen: Alert, oriented. Appears stated age.  HEENT: [place]/AT. PERRLA. Lungs: CTA, no wheezes. CV: RR nl S1, S2. Abd: soft, benign, no masses. BS+ Ext: No edema. Pulses 2+    Planned procedures: Proceed with colonoscopy. The patient understands the nature of the planned procedure, indications, risks, alternatives and potential complications including but not limited to bleeding, infection, perforation, damage to internal organs and possible oversedation/side effects from anesthesia. The patient agrees and gives consent to proceed.  Please refer to procedure notes for findings, recommendations and  patient disposition/instructions.     Gus Littler K. Whitney Morris, M.D. Gastroenterology 04/24/2020  9:35 AM

## 2020-04-24 NOTE — Anesthesia Preprocedure Evaluation (Signed)
Anesthesia Evaluation  Patient identified by MRN, date of birth, ID band Patient awake    Reviewed: Allergy & Precautions, NPO status , Patient's Chart, lab work & pertinent test results  History of Anesthesia Complications Negative for: history of anesthetic complications  Airway Mallampati: II       Dental   Pulmonary neg sleep apnea, neg COPD, Not current smoker, former smoker,           Cardiovascular hypertension, Pt. on medications (-) Past MI and (-) CHF (-) dysrhythmias (-) Valvular Problems/Murmurs     Neuro/Psych neg Seizures    GI/Hepatic Neg liver ROS, GERD  Medicated and Controlled,  Endo/Other  diabetes, Type 2, Oral Hypoglycemic Agents, Insulin Dependent  Renal/GU negative Renal ROS     Musculoskeletal   Abdominal   Peds  Hematology   Anesthesia Other Findings   Reproductive/Obstetrics                             Anesthesia Physical Anesthesia Plan  ASA: III  Anesthesia Plan: General   Post-op Pain Management:    Induction: Intravenous  PONV Risk Score and Plan: 3 and Treatment may vary due to age or medical condition, Propofol infusion and TIVA  Airway Management Planned: Nasal Cannula  Additional Equipment:   Intra-op Plan:   Post-operative Plan:   Informed Consent: I have reviewed the patients History and Physical, chart, labs and discussed the procedure including the risks, benefits and alternatives for the proposed anesthesia with the patient or authorized representative who has indicated his/her understanding and acceptance.       Plan Discussed with:   Anesthesia Plan Comments:         Anesthesia Quick Evaluation

## 2020-04-25 ENCOUNTER — Encounter: Payer: Self-pay | Admitting: Internal Medicine

## 2020-04-25 LAB — SURGICAL PATHOLOGY

## 2020-05-28 ENCOUNTER — Telehealth: Payer: Self-pay | Admitting: Emergency Medicine

## 2020-05-28 MED ORDER — CEPHALEXIN 500 MG PO CAPS
500.0000 mg | ORAL_CAPSULE | Freq: Four times a day (QID) | ORAL | 0 refills | Status: AC
Start: 2020-05-28 — End: 2020-06-04

## 2020-05-28 MED ORDER — PHENAZOPYRIDINE HCL 200 MG PO TABS: 200.0000 mg | ORAL_TABLET | Freq: Three times a day (TID) | ORAL | 0 refills | Status: AC | PRN

## 2020-05-28 NOTE — Telephone Encounter (Signed)
Called in meds for uti

## 2020-10-24 DIAGNOSIS — Z20828 Contact with and (suspected) exposure to other viral communicable diseases: Secondary | ICD-10-CM | POA: Diagnosis not present

## 2020-11-21 DIAGNOSIS — E042 Nontoxic multinodular goiter: Secondary | ICD-10-CM | POA: Diagnosis not present

## 2020-11-21 DIAGNOSIS — I10 Essential (primary) hypertension: Secondary | ICD-10-CM | POA: Diagnosis not present

## 2020-11-21 DIAGNOSIS — E1165 Type 2 diabetes mellitus with hyperglycemia: Secondary | ICD-10-CM | POA: Diagnosis not present

## 2020-11-21 DIAGNOSIS — E782 Mixed hyperlipidemia: Secondary | ICD-10-CM | POA: Diagnosis not present

## 2020-12-03 ENCOUNTER — Other Ambulatory Visit: Payer: Self-pay | Admitting: Family Medicine

## 2020-12-03 DIAGNOSIS — I10 Essential (primary) hypertension: Secondary | ICD-10-CM | POA: Diagnosis not present

## 2020-12-03 DIAGNOSIS — N898 Other specified noninflammatory disorders of vagina: Secondary | ICD-10-CM | POA: Diagnosis not present

## 2020-12-03 DIAGNOSIS — E1165 Type 2 diabetes mellitus with hyperglycemia: Secondary | ICD-10-CM | POA: Diagnosis not present

## 2020-12-03 DIAGNOSIS — Z0000 Encounter for general adult medical examination without abnormal findings: Secondary | ICD-10-CM | POA: Diagnosis not present

## 2020-12-03 DIAGNOSIS — Z1231 Encounter for screening mammogram for malignant neoplasm of breast: Secondary | ICD-10-CM

## 2020-12-03 DIAGNOSIS — E559 Vitamin D deficiency, unspecified: Secondary | ICD-10-CM | POA: Diagnosis not present

## 2020-12-03 DIAGNOSIS — E7800 Pure hypercholesterolemia, unspecified: Secondary | ICD-10-CM | POA: Diagnosis not present

## 2021-01-08 ENCOUNTER — Ambulatory Visit
Admission: RE | Admit: 2021-01-08 | Discharge: 2021-01-08 | Disposition: A | Discharge status: Home / Self Care | Payer: 59 | Source: Ambulatory Visit | Attending: Family Medicine | Admitting: Family Medicine

## 2021-01-08 ENCOUNTER — Other Ambulatory Visit: Payer: Self-pay

## 2021-01-08 DIAGNOSIS — Z1231 Encounter for screening mammogram for malignant neoplasm of breast: Secondary | ICD-10-CM | POA: Diagnosis not present

## 2021-03-21 DIAGNOSIS — E042 Nontoxic multinodular goiter: Secondary | ICD-10-CM | POA: Diagnosis not present

## 2021-03-31 DIAGNOSIS — Z794 Long term (current) use of insulin: Secondary | ICD-10-CM | POA: Diagnosis not present

## 2021-03-31 DIAGNOSIS — E1165 Type 2 diabetes mellitus with hyperglycemia: Secondary | ICD-10-CM | POA: Diagnosis not present

## 2021-03-31 DIAGNOSIS — E782 Mixed hyperlipidemia: Secondary | ICD-10-CM | POA: Diagnosis not present

## 2021-03-31 DIAGNOSIS — E042 Nontoxic multinodular goiter: Secondary | ICD-10-CM | POA: Diagnosis not present

## 2021-03-31 DIAGNOSIS — I10 Essential (primary) hypertension: Secondary | ICD-10-CM | POA: Diagnosis not present

## 2021-05-14 DIAGNOSIS — E042 Nontoxic multinodular goiter: Secondary | ICD-10-CM | POA: Diagnosis not present

## 2021-05-15 DIAGNOSIS — E041 Nontoxic single thyroid nodule: Secondary | ICD-10-CM | POA: Diagnosis not present

## 2021-06-24 DIAGNOSIS — Z6841 Body Mass Index (BMI) 40.0 and over, adult: Secondary | ICD-10-CM | POA: Diagnosis not present

## 2021-06-24 DIAGNOSIS — E7800 Pure hypercholesterolemia, unspecified: Secondary | ICD-10-CM | POA: Diagnosis not present

## 2021-06-24 DIAGNOSIS — I10 Essential (primary) hypertension: Secondary | ICD-10-CM | POA: Diagnosis not present

## 2021-06-24 DIAGNOSIS — E559 Vitamin D deficiency, unspecified: Secondary | ICD-10-CM | POA: Diagnosis not present

## 2021-07-09 DIAGNOSIS — E113293 Type 2 diabetes mellitus with mild nonproliferative diabetic retinopathy without macular edema, bilateral: Secondary | ICD-10-CM | POA: Diagnosis not present

## 2021-08-11 IMAGING — MG DIGITAL SCREENING BILAT W/ TOMO W/ CAD
6 of 10 series · 6 of 30 positions shown · non-contrast
Comparison: Previous exam(s).

ACR Breast Density Category a: The breast tissue is almost entirely
fatty.

CLINICAL DATA: Screening.

EXAM:
DIGITAL SCREENING BILATERAL MAMMOGRAM WITH TOMO AND CAD

[L MLO synth-2D (1 of 2)]
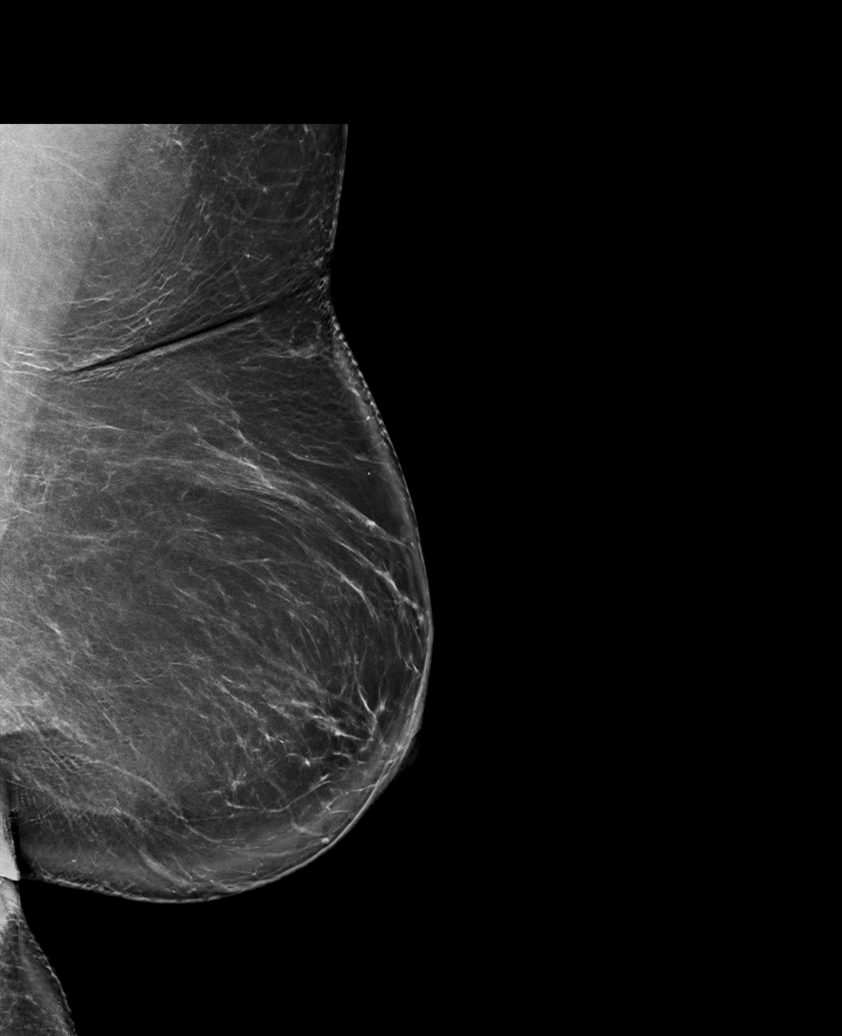

[L MLO synth-2D (2 of 2)]
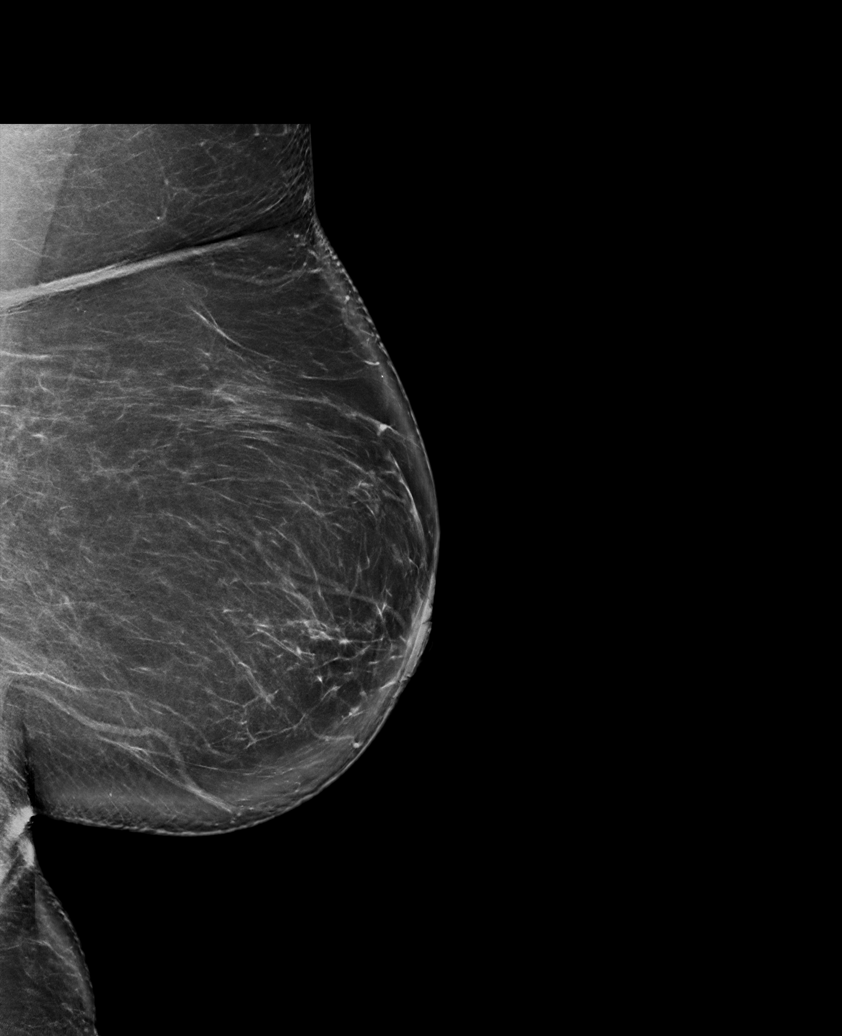

[R CC synth-2D]
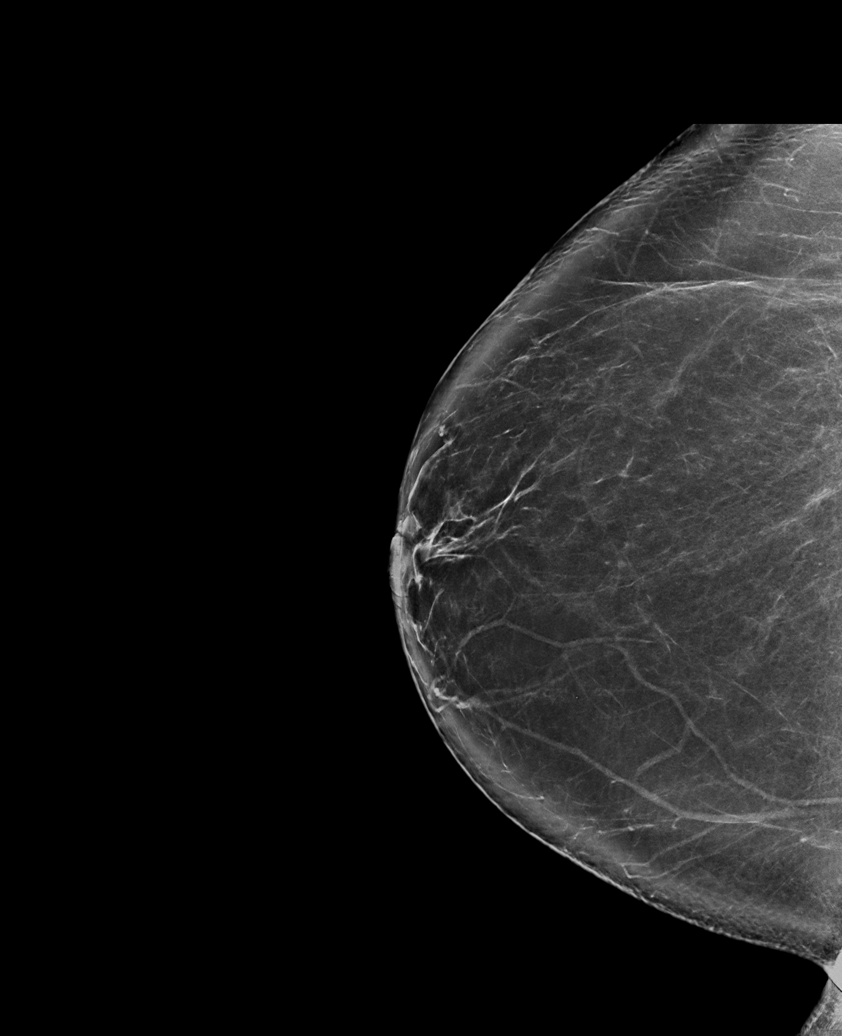

[R MLO synth-2D]
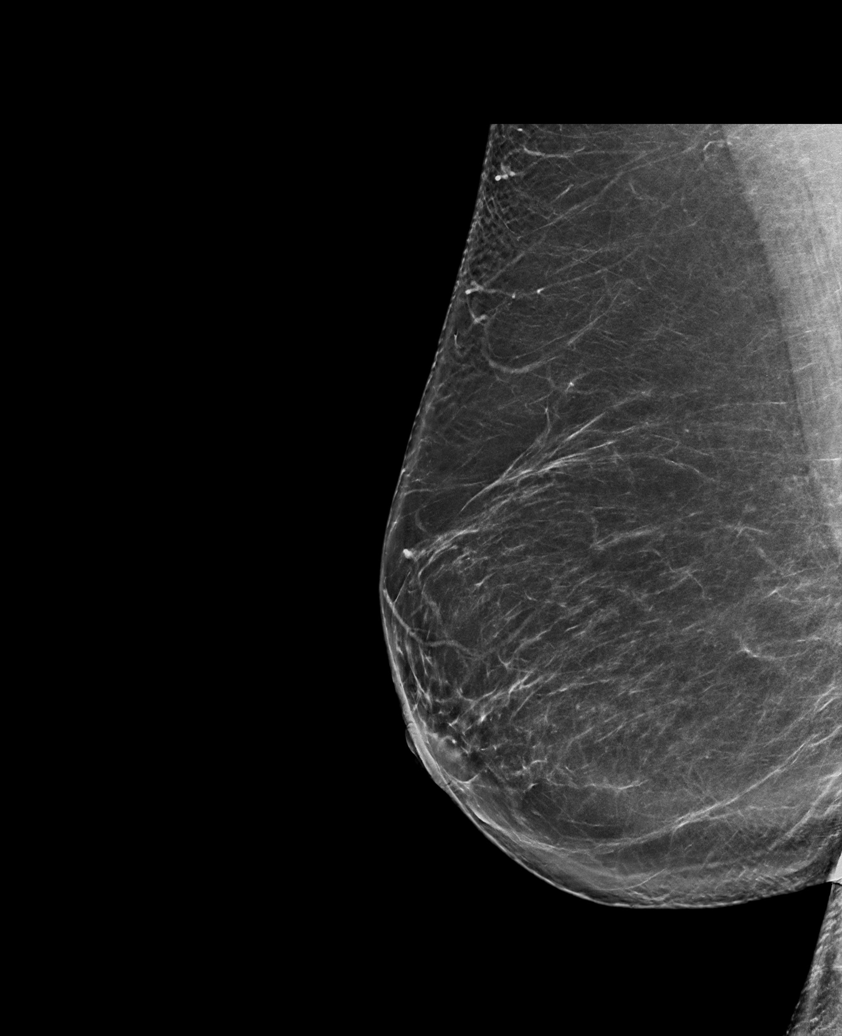

[L CC synth-2D]
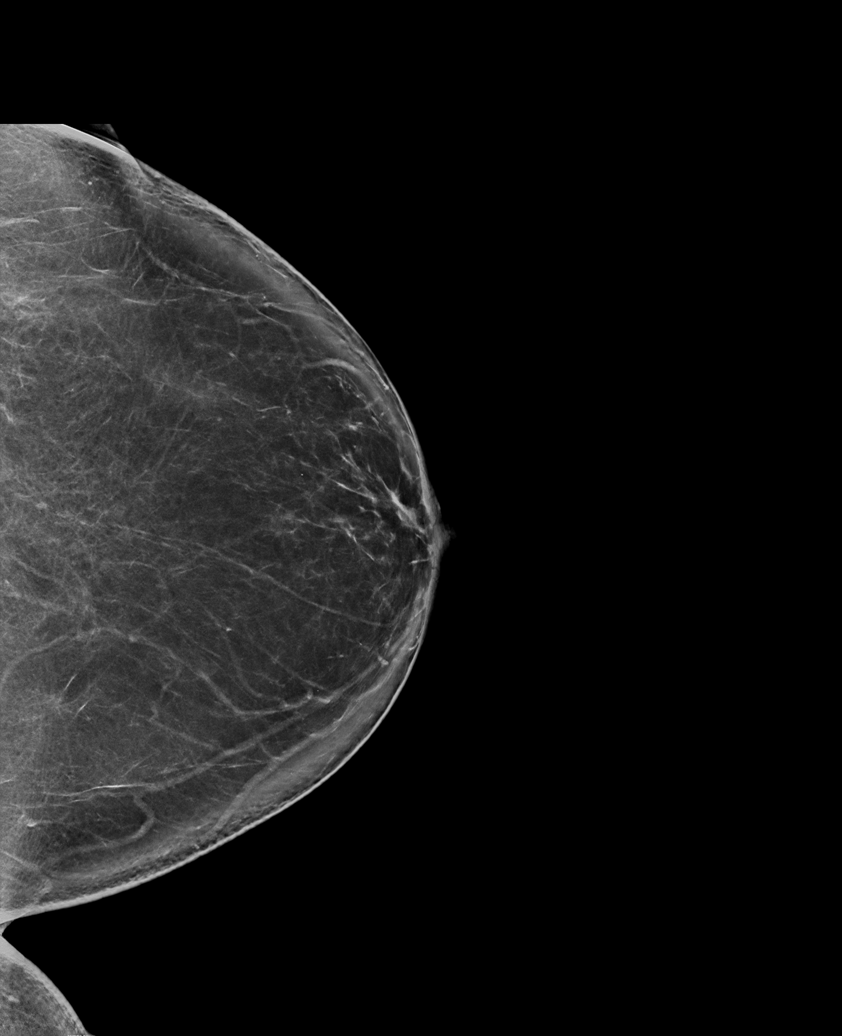

[L CC tomo · tomo slice 47/94.0]
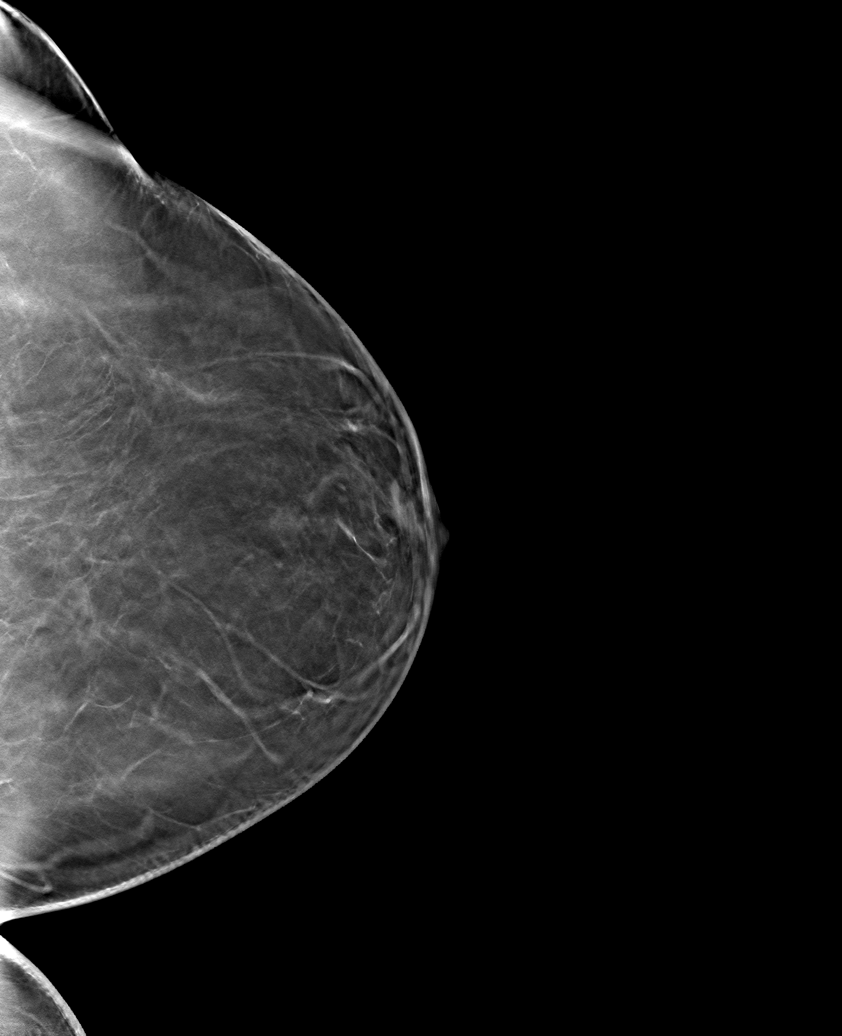

[6 of 30 positions shown; findings below may reference images not displayed]

FINDINGS: There are no findings suspicious for malignancy. Images were
processed with CAD.
IMPRESSION: No mammographic evidence of malignancy. A result letter of this
screening mammogram will be mailed directly to the patient.

RECOMMENDATION:
Screening mammogram in one year. (Code:[id number]-[id number])

BI-RADS CATEGORY  1: Negative.

## 2021-09-10 DIAGNOSIS — Z794 Long term (current) use of insulin: Secondary | ICD-10-CM | POA: Diagnosis not present

## 2021-09-10 DIAGNOSIS — E042 Nontoxic multinodular goiter: Secondary | ICD-10-CM | POA: Diagnosis not present

## 2021-09-10 DIAGNOSIS — E1165 Type 2 diabetes mellitus with hyperglycemia: Secondary | ICD-10-CM | POA: Diagnosis not present

## 2021-09-10 DIAGNOSIS — I10 Essential (primary) hypertension: Secondary | ICD-10-CM | POA: Diagnosis not present

## 2021-09-10 DIAGNOSIS — E113293 Type 2 diabetes mellitus with mild nonproliferative diabetic retinopathy without macular edema, bilateral: Secondary | ICD-10-CM | POA: Diagnosis not present

## 2021-10-20 DIAGNOSIS — Z23 Encounter for immunization: Secondary | ICD-10-CM | POA: Diagnosis not present

## 2021-10-20 DIAGNOSIS — I10 Essential (primary) hypertension: Secondary | ICD-10-CM | POA: Diagnosis not present

## 2021-10-20 DIAGNOSIS — E119 Type 2 diabetes mellitus without complications: Secondary | ICD-10-CM | POA: Diagnosis not present

## 2021-10-20 DIAGNOSIS — Z6841 Body Mass Index (BMI) 40.0 and over, adult: Secondary | ICD-10-CM | POA: Diagnosis not present

## 2021-10-20 DIAGNOSIS — E7800 Pure hypercholesterolemia, unspecified: Secondary | ICD-10-CM | POA: Diagnosis not present

## 2021-10-20 DIAGNOSIS — L989 Disorder of the skin and subcutaneous tissue, unspecified: Secondary | ICD-10-CM | POA: Diagnosis not present

## 2021-10-20 DIAGNOSIS — R300 Dysuria: Secondary | ICD-10-CM | POA: Diagnosis not present

## 2021-11-17 DIAGNOSIS — Z8041 Family history of malignant neoplasm of ovary: Secondary | ICD-10-CM | POA: Diagnosis not present

## 2021-12-15 ENCOUNTER — Other Ambulatory Visit: Payer: Self-pay | Admitting: Family Medicine

## 2021-12-15 DIAGNOSIS — Z1231 Encounter for screening mammogram for malignant neoplasm of breast: Secondary | ICD-10-CM

## 2022-01-21 ENCOUNTER — Ambulatory Visit
Admission: RE | Admit: 2022-01-21 | Discharge: 2022-01-21 | Disposition: A | Discharge status: Home / Self Care | Payer: 59 | Source: Ambulatory Visit | Attending: Family Medicine | Admitting: Family Medicine

## 2022-01-21 DIAGNOSIS — Z1231 Encounter for screening mammogram for malignant neoplasm of breast: Secondary | ICD-10-CM | POA: Diagnosis not present

## 2022-03-11 DIAGNOSIS — E113293 Type 2 diabetes mellitus with mild nonproliferative diabetic retinopathy without macular edema, bilateral: Secondary | ICD-10-CM | POA: Diagnosis not present

## 2022-03-11 DIAGNOSIS — Z794 Long term (current) use of insulin: Secondary | ICD-10-CM | POA: Diagnosis not present

## 2022-03-11 DIAGNOSIS — E042 Nontoxic multinodular goiter: Secondary | ICD-10-CM | POA: Diagnosis not present

## 2022-04-15 DIAGNOSIS — E113293 Type 2 diabetes mellitus with mild nonproliferative diabetic retinopathy without macular edema, bilateral: Secondary | ICD-10-CM | POA: Diagnosis not present

## 2022-04-15 DIAGNOSIS — I10 Essential (primary) hypertension: Secondary | ICD-10-CM | POA: Diagnosis not present

## 2022-04-15 DIAGNOSIS — E042 Nontoxic multinodular goiter: Secondary | ICD-10-CM | POA: Diagnosis not present

## 2022-04-15 DIAGNOSIS — Z794 Long term (current) use of insulin: Secondary | ICD-10-CM | POA: Diagnosis not present

## 2022-04-24 DIAGNOSIS — Z0000 Encounter for general adult medical examination without abnormal findings: Secondary | ICD-10-CM | POA: Diagnosis not present

## 2022-05-01 DIAGNOSIS — Z0000 Encounter for general adult medical examination without abnormal findings: Secondary | ICD-10-CM | POA: Diagnosis not present

## 2022-05-01 DIAGNOSIS — Z6841 Body Mass Index (BMI) 40.0 and over, adult: Secondary | ICD-10-CM | POA: Diagnosis not present

## 2022-05-01 DIAGNOSIS — I10 Essential (primary) hypertension: Secondary | ICD-10-CM | POA: Diagnosis not present

## 2022-05-01 DIAGNOSIS — E559 Vitamin D deficiency, unspecified: Secondary | ICD-10-CM | POA: Diagnosis not present

## 2022-05-01 DIAGNOSIS — Z23 Encounter for immunization: Secondary | ICD-10-CM | POA: Diagnosis not present

## 2022-05-01 DIAGNOSIS — E7800 Pure hypercholesterolemia, unspecified: Secondary | ICD-10-CM | POA: Diagnosis not present

## 2022-05-13 DIAGNOSIS — I10 Essential (primary) hypertension: Secondary | ICD-10-CM | POA: Diagnosis not present

## 2022-05-13 DIAGNOSIS — G44209 Tension-type headache, unspecified, not intractable: Secondary | ICD-10-CM | POA: Diagnosis not present

## 2022-07-16 DIAGNOSIS — E113293 Type 2 diabetes mellitus with mild nonproliferative diabetic retinopathy without macular edema, bilateral: Secondary | ICD-10-CM | POA: Diagnosis not present

## 2022-10-13 DIAGNOSIS — I10 Essential (primary) hypertension: Secondary | ICD-10-CM | POA: Diagnosis not present

## 2022-10-13 DIAGNOSIS — Z794 Long term (current) use of insulin: Secondary | ICD-10-CM | POA: Diagnosis not present

## 2022-10-13 DIAGNOSIS — E113293 Type 2 diabetes mellitus with mild nonproliferative diabetic retinopathy without macular edema, bilateral: Secondary | ICD-10-CM | POA: Diagnosis not present

## 2022-10-19 DIAGNOSIS — E042 Nontoxic multinodular goiter: Secondary | ICD-10-CM | POA: Diagnosis not present

## 2022-10-19 DIAGNOSIS — Z794 Long term (current) use of insulin: Secondary | ICD-10-CM | POA: Diagnosis not present

## 2022-10-19 DIAGNOSIS — E1165 Type 2 diabetes mellitus with hyperglycemia: Secondary | ICD-10-CM | POA: Diagnosis not present

## 2022-10-19 DIAGNOSIS — E113293 Type 2 diabetes mellitus with mild nonproliferative diabetic retinopathy without macular edema, bilateral: Secondary | ICD-10-CM | POA: Diagnosis not present

## 2022-10-19 DIAGNOSIS — I10 Essential (primary) hypertension: Secondary | ICD-10-CM | POA: Diagnosis not present

## 2022-10-30 DIAGNOSIS — I10 Essential (primary) hypertension: Secondary | ICD-10-CM | POA: Diagnosis not present

## 2022-10-30 DIAGNOSIS — R8290 Unspecified abnormal findings in urine: Secondary | ICD-10-CM | POA: Diagnosis not present

## 2022-10-30 DIAGNOSIS — Z6841 Body Mass Index (BMI) 40.0 and over, adult: Secondary | ICD-10-CM | POA: Diagnosis not present

## 2022-10-30 DIAGNOSIS — E559 Vitamin D deficiency, unspecified: Secondary | ICD-10-CM | POA: Diagnosis not present

## 2022-10-30 DIAGNOSIS — E7800 Pure hypercholesterolemia, unspecified: Secondary | ICD-10-CM | POA: Diagnosis not present

## 2022-11-06 DIAGNOSIS — E559 Vitamin D deficiency, unspecified: Secondary | ICD-10-CM | POA: Diagnosis not present

## 2022-11-06 DIAGNOSIS — E042 Nontoxic multinodular goiter: Secondary | ICD-10-CM | POA: Diagnosis not present

## 2022-11-06 DIAGNOSIS — E119 Type 2 diabetes mellitus without complications: Secondary | ICD-10-CM | POA: Diagnosis not present

## 2022-11-06 DIAGNOSIS — Z6841 Body Mass Index (BMI) 40.0 and over, adult: Secondary | ICD-10-CM | POA: Diagnosis not present

## 2022-11-06 DIAGNOSIS — E7800 Pure hypercholesterolemia, unspecified: Secondary | ICD-10-CM | POA: Diagnosis not present

## 2022-11-06 DIAGNOSIS — I10 Essential (primary) hypertension: Secondary | ICD-10-CM | POA: Diagnosis not present

## 2022-12-17 ENCOUNTER — Other Ambulatory Visit: Payer: Self-pay | Admitting: Family Medicine

## 2022-12-17 DIAGNOSIS — Z1231 Encounter for screening mammogram for malignant neoplasm of breast: Secondary | ICD-10-CM

## 2023-01-26 ENCOUNTER — Ambulatory Visit
Admission: RE | Admit: 2023-01-26 | Discharge: 2023-01-26 | Disposition: A | Discharge status: Home / Self Care | Payer: BC Managed Care – PPO | Source: Ambulatory Visit | Attending: Family Medicine | Admitting: Family Medicine

## 2023-01-26 DIAGNOSIS — Z1231 Encounter for screening mammogram for malignant neoplasm of breast: Secondary | ICD-10-CM | POA: Insufficient documentation

## 2023-07-15 DIAGNOSIS — Z23 Encounter for immunization: Secondary | ICD-10-CM | POA: Diagnosis not present

## 2024-01-11 ENCOUNTER — Other Ambulatory Visit: Payer: Self-pay | Admitting: Family Medicine

## 2024-01-11 DIAGNOSIS — Z1231 Encounter for screening mammogram for malignant neoplasm of breast: Secondary | ICD-10-CM

## 2024-01-27 ENCOUNTER — Ambulatory Visit
Admission: RE | Admit: 2024-01-27 | Discharge: 2024-01-27 | Disposition: A | Discharge status: Home / Self Care | Source: Ambulatory Visit | Attending: Family Medicine | Admitting: Family Medicine

## 2024-01-27 DIAGNOSIS — Z1231 Encounter for screening mammogram for malignant neoplasm of breast: Secondary | ICD-10-CM | POA: Insufficient documentation

## 2024-07-20 DIAGNOSIS — Z23 Encounter for immunization: Secondary | ICD-10-CM | POA: Diagnosis not present
